# Patient Record
Sex: Female | Born: 1965 | Race: White | Hispanic: No | Marital: Married | Smoking: Current every day smoker
Health system: Southern US, Community
[De-identification: ages and names within clinical notes are randomized; demographics above are authoritative.]

## PROBLEM LIST (undated history)

## (undated) DIAGNOSIS — Z8489 Family history of other specified conditions: Secondary | ICD-10-CM

## (undated) DIAGNOSIS — F419 Anxiety disorder, unspecified: Secondary | ICD-10-CM

## (undated) DIAGNOSIS — K219 Gastro-esophageal reflux disease without esophagitis: Secondary | ICD-10-CM

## (undated) DIAGNOSIS — J189 Pneumonia, unspecified organism: Secondary | ICD-10-CM

## (undated) DIAGNOSIS — I499 Cardiac arrhythmia, unspecified: Secondary | ICD-10-CM

## (undated) DIAGNOSIS — R000 Tachycardia, unspecified: Secondary | ICD-10-CM

## (undated) DIAGNOSIS — IMO0001 Reserved for inherently not codable concepts without codable children: Secondary | ICD-10-CM

## (undated) HISTORY — PX: DILATION AND CURETTAGE OF UTERUS: SHX78

## (undated) HISTORY — PX: COLPOSCOPY: SHX161

---

## 2008-04-27 ENCOUNTER — Ambulatory Visit (HOSPITAL_COMMUNITY): Admission: RE | Admit: 2008-04-27 | Discharge: 2008-04-27 | Payer: Self-pay | Admitting: Unknown Physician Specialty

## 2010-11-19 ENCOUNTER — Encounter: Payer: Self-pay | Admitting: Unknown Physician Specialty

## 2010-11-20 ENCOUNTER — Encounter: Payer: Self-pay | Admitting: Unknown Physician Specialty

## 2015-03-29 ENCOUNTER — Other Ambulatory Visit: Payer: Self-pay | Admitting: Neurosurgery

## 2015-03-29 DIAGNOSIS — M5126 Other intervertebral disc displacement, lumbar region: Secondary | ICD-10-CM

## 2015-04-04 ENCOUNTER — Ambulatory Visit
Admission: RE | Admit: 2015-04-04 | Discharge: 2015-04-04 | Disposition: A | Discharge status: Home / Self Care | Payer: Federal, State, Local not specified - PPO | Source: Ambulatory Visit | Attending: Neurosurgery | Admitting: Neurosurgery

## 2015-04-04 DIAGNOSIS — M5126 Other intervertebral disc displacement, lumbar region: Secondary | ICD-10-CM

## 2015-04-04 MED ORDER — ONDANSETRON HCL 4 MG/2ML IJ SOLN: 4.0000 mg | Freq: Four times a day (QID) | INTRAMUSCULAR | Status: DC | PRN

## 2015-04-04 MED ORDER — DIAZEPAM 5 MG PO TABS: 10.0000 mg | ORAL_TABLET | Freq: Once | ORAL | Status: DC

## 2015-04-04 MED ORDER — DIAZEPAM 5 MG PO TABS
10.0000 mg | ORAL_TABLET | Freq: Once | ORAL | Status: AC
  Administered 2015-04-04: 10 mg via ORAL

## 2015-04-04 MED ORDER — IOHEXOL 180 MG/ML  SOLN
15.0000 mL | Freq: Once | INTRAMUSCULAR | Status: AC | PRN
  Administered 2015-04-04: 15 mL via INTRATHECAL

## 2015-04-04 NOTE — Discharge Instructions (Signed)
Myelogram Discharge Instructions  1. Go home and rest quietly for the next 24 hours.  It is important to lie flat for the next 24 hours.  Get up only to go to the restroom.  You may lie in the bed or on a couch on your back, your stomach, your left side or your right side.  You may have one pillow under your head.  You may have pillows between your knees while you are on your side or under your knees while you are on your back.  2. DO NOT drive today.  Recline the seat as far back as it will go, while still wearing your seat belt, on the way home.  3. You may get up to go to the bathroom as needed.  You may sit up for 10 minutes to eat.  You may resume your normal diet and medications unless otherwise indicated.  Drink lots of extra fluids today and tomorrow.  4. The incidence of headache, nausea, or vomiting is about 5% (one in 20 patients).  If you develop a headache, lie flat and drink plenty of fluids until the headache goes away.  Caffeinated beverages may be helpful.  If you develop severe nausea and vomiting or a headache that does not go away with flat bed rest, call [phone].  5. You may resume normal activities after your 24 hours of bed rest is over; however, do not exert yourself strongly or do any heavy lifting tomorrow. If when you get up you have a headache when standing, go back to bed and force fluids for another 24 hours.  6. Call your physician for a follow-up appointment.  The results of your myelogram will be sent directly to your physician by the following day.  7. If you have any questions or if complications develop after you arrive home, please call [phone].  Discharge instructions have been explained to the patient.  The patient, or the person responsible for the patient, fully understands these instructions.       May resume Fetzima on April 05, 2015, after 8:30 am.

## 2015-04-04 NOTE — Progress Notes (Signed)
Patient states she has been off Fetzima for the past two days.

## 2015-04-05 ENCOUNTER — Other Ambulatory Visit: Payer: Self-pay | Admitting: Neurosurgery

## 2015-04-07 ENCOUNTER — Encounter (HOSPITAL_COMMUNITY): Payer: Self-pay | Admitting: *Deleted

## 2015-04-07 MED ORDER — CEFAZOLIN SODIUM-DEXTROSE 2-3 GM-% IV SOLR
2.0000 g | INTRAVENOUS | Status: AC
  Administered 2015-04-08: 2 g via INTRAVENOUS
  Filled 2015-04-07: qty 50

## 2015-04-07 NOTE — Progress Notes (Addendum)
Mrs Makaleigh reports having chest pain-last time was 2 months ago. "Sharp pain middle of chest, 9 out of 10, couldn't catch breath and I would get nauseous.""  Patient also reports that it has happened a time or two in past year, "i just try to calm myself down and take deep breathes.  "One time it lasted a while and I had to take 2 Ativans."  Patient  York Spaniel that she informed her PCP, and he changed her anti depressant.  Patient also reported having a "fast heart rate, 100 is probably the lowest, denies any shortness of breath or lightheadedness with this. not get it on an EKG."   Patient had a stress at [facility] a year or so ago, but did not hear the results.   I called the OR to speak to the anesthesiologist regarding Mrs Morua reported chest pain, I spoke with CC, RN, charge nurse, who took the information for Dr Okey Dupre who was in an emergency surgery.

## 2015-04-07 NOTE — H&P (Signed)
Natalie Hines is an 49 y.o. female.   Chief Complaint: lumbar pain HPI: patient complaining of lumbar pain with radiation to the right led but now is going to both legs, no better with conservative treatment. Myelo was positive for stenosis  Past Medical History  Diagnosis Date  . Tachycardia   . Anxiety   . Dysrhythmia   . Irregular heart beat     at times  . Shortness of breath dyspnea     At times  . Pneumonia     as a teenager 3  times  . GERD (gastroesophageal reflux disease)     occasional- takes Zantac OTC  . Family history of adverse reaction to anesthesia     Mother and Sister- Nausea    Past Surgical History  Procedure Laterality Date  . Dilation and curettage of uterus    . Colposcopy      History reviewed. No pertinent family history. Social History:  reports that she has been smoking.  She does not have any smokeless tobacco history on file. She reports that she drinks alcohol. She reports that she does not use illicit drugs.  Allergies:  Allergies  Allergen Reactions  . Compazine [Prochlorperazine Edisylate] Other (See Comments)    Draws mouth and neck muscles open (tardive dyskinesia)    No prescriptions prior to admission    No results found for this or any previous visit (from the past 48 hour(s)). No results found.  Review of Systems  Constitutional: Negative.   HENT: Negative.   Eyes: Negative.   Respiratory: Negative.   Cardiovascular: Negative.   Gastrointestinal: Negative.   Genitourinary: Negative.   Musculoskeletal: Positive for back pain.  Skin: Negative.   Neurological: Positive for focal weakness.  Endo/Heme/Allergies: Negative.   Psychiatric/Behavioral: Negative.     Height 5\' 7"  (1.702 m), weight 79.379 kg (175 lb), last menstrual period 03/28/2015. Physical Exam hent, nl. Neck, nl. Cv, nl. Lungs, clear. Abdomen, soft. Extremities, nl. Neuro weakness of df right foot. Sensory nl. SLR POSITIVE AT 70 DEGREES. MYELO SHOS STENOSIS AT  L34, 45 right worse than left  Assessment/Plan Patient to go ahead with decompression at l34,45 . She and her husband are aware of risks and benefits  Miliana Gangwer M 04/07/2015, 6:40 PM

## 2015-04-08 ENCOUNTER — Inpatient Hospital Stay (HOSPITAL_COMMUNITY)
Admission: RE | Admit: 2015-04-08 | Discharge: 2015-04-09 | DRG: 517 | Disposition: A | Discharge status: Home / Self Care | Payer: Federal, State, Local not specified - PPO | Source: Ambulatory Visit | Attending: Neurosurgery | Admitting: Neurosurgery

## 2015-04-08 ENCOUNTER — Ambulatory Visit (HOSPITAL_COMMUNITY): Payer: Federal, State, Local not specified - PPO | Admitting: Certified Registered Nurse Anesthetist

## 2015-04-08 ENCOUNTER — Encounter (HOSPITAL_COMMUNITY): Payer: Self-pay

## 2015-04-08 ENCOUNTER — Ambulatory Visit (HOSPITAL_COMMUNITY): Payer: Federal, State, Local not specified - PPO

## 2015-04-08 ENCOUNTER — Encounter (HOSPITAL_COMMUNITY)
Admission: RE | Disposition: A | Discharge status: Home / Self Care | Payer: Self-pay | Source: Ambulatory Visit | Attending: Neurosurgery

## 2015-04-08 DIAGNOSIS — M4806 Spinal stenosis, lumbar region: Principal | ICD-10-CM | POA: Diagnosis present

## 2015-04-08 DIAGNOSIS — Z888 Allergy status to other drugs, medicaments and biological substances status: Secondary | ICD-10-CM | POA: Diagnosis not present

## 2015-04-08 DIAGNOSIS — M5416 Radiculopathy, lumbar region: Secondary | ICD-10-CM | POA: Diagnosis present

## 2015-04-08 DIAGNOSIS — K219 Gastro-esophageal reflux disease without esophagitis: Secondary | ICD-10-CM | POA: Diagnosis present

## 2015-04-08 DIAGNOSIS — M48061 Spinal stenosis, lumbar region without neurogenic claudication: Secondary | ICD-10-CM | POA: Diagnosis present

## 2015-04-08 DIAGNOSIS — F17210 Nicotine dependence, cigarettes, uncomplicated: Secondary | ICD-10-CM | POA: Diagnosis present

## 2015-04-08 DIAGNOSIS — F419 Anxiety disorder, unspecified: Secondary | ICD-10-CM | POA: Diagnosis present

## 2015-04-08 DIAGNOSIS — M545 Low back pain: Secondary | ICD-10-CM | POA: Diagnosis present

## 2015-04-08 DIAGNOSIS — Z419 Encounter for procedure for purposes other than remedying health state, unspecified: Secondary | ICD-10-CM

## 2015-04-08 HISTORY — DX: Cardiac arrhythmia, unspecified: I49.9

## 2015-04-08 HISTORY — PX: LUMBAR LAMINECTOMY/DECOMPRESSION MICRODISCECTOMY: SHX5026

## 2015-04-08 HISTORY — DX: Gastro-esophageal reflux disease without esophagitis: K21.9

## 2015-04-08 HISTORY — DX: Reserved for inherently not codable concepts without codable children: IMO0001

## 2015-04-08 HISTORY — DX: Anxiety disorder, unspecified: F41.9

## 2015-04-08 HISTORY — DX: Tachycardia, unspecified: R00.0

## 2015-04-08 HISTORY — DX: Family history of other specified conditions: Z84.89

## 2015-04-08 HISTORY — DX: Pneumonia, unspecified organism: J18.9

## 2015-04-08 LAB — CBC
HEMATOCRIT: 39.7 % (ref 36.0–46.0)
Hemoglobin: 13.5 g/dL (ref 12.0–15.0)
MCH: 29.9 pg (ref 26.0–34.0)
MCHC: 34 g/dL (ref 30.0–36.0)
MCV: 87.8 fL (ref 78.0–100.0)
Platelets: 348 10*3/uL (ref 150–400)
RBC: 4.52 MIL/uL (ref 3.87–5.11)
RDW: 13.5 % (ref 11.5–15.5)
WBC: 6.4 10*3/uL (ref 4.0–10.5)

## 2015-04-08 LAB — BASIC METABOLIC PANEL
Anion gap: 8 (ref 5–15)
BUN: 13 mg/dL (ref 6–20)
CHLORIDE: 108 mmol/L (ref 101–111)
CO2: 22 mmol/L (ref 22–32)
CREATININE: 0.71 mg/dL (ref 0.44–1.00)
Calcium: 8.7 mg/dL — ABNORMAL LOW (ref 8.9–10.3)
GFR calc Af Amer: >60 mL/min (ref >60)
GLUCOSE: 95 mg/dL (ref 65–99)
Potassium: 4.1 mmol/L (ref 3.5–5.1)
SODIUM: 138 mmol/L (ref 135–145)

## 2015-04-08 LAB — SURGICAL PCR SCREEN
MRSA, PCR: NEGATIVE
Staphylococcus aureus: POSITIVE — AB

## 2015-04-08 LAB — HCG, SERUM, QUALITATIVE: PREG SERUM: NEGATIVE

## 2015-04-08 SURGERY — LUMBAR LAMINECTOMY/DECOMPRESSION MICRODISCECTOMY 2 LEVELS
Anesthesia: General | Site: Spine Lumbar | Laterality: Bilateral

## 2015-04-08 MED ORDER — VANCOMYCIN HCL 1000 MG IV SOLR
INTRAVENOUS | Status: AC
  Filled 2015-04-08: qty 1000

## 2015-04-08 MED ORDER — ACETAMINOPHEN 325 MG PO TABS
650.0000 mg | ORAL_TABLET | ORAL | Status: DC | PRN
  Filled 2015-04-08: qty 2

## 2015-04-08 MED ORDER — MENTHOL 3 MG MT LOZG: 1.0000 | LOZENGE | OROMUCOSAL | Status: DC | PRN

## 2015-04-08 MED ORDER — DEXAMETHASONE SODIUM PHOSPHATE 4 MG/ML IJ SOLN
INTRAMUSCULAR | Status: DC | PRN
  Administered 2015-04-08: 4 mg via INTRAVENOUS

## 2015-04-08 MED ORDER — MIDAZOLAM HCL 2 MG/2ML IJ SOLN
INTRAMUSCULAR | Status: AC
  Filled 2015-04-08: qty 2

## 2015-04-08 MED ORDER — ONDANSETRON HCL 4 MG/2ML IJ SOLN
INTRAMUSCULAR | Status: AC
  Filled 2015-04-08: qty 2

## 2015-04-08 MED ORDER — CEFAZOLIN SODIUM 1-5 GM-% IV SOLN
1.0000 g | Freq: Three times a day (TID) | INTRAVENOUS | Status: AC
  Administered 2015-04-09 (×2): 1 g via INTRAVENOUS
  Filled 2015-04-08 (×2): qty 50

## 2015-04-08 MED ORDER — ACETAMINOPHEN 650 MG RE SUPP
650.0000 mg | RECTAL | Status: DC | PRN
  Filled 2015-04-08: qty 1

## 2015-04-08 MED ORDER — DEXAMETHASONE SODIUM PHOSPHATE 4 MG/ML IJ SOLN
INTRAMUSCULAR | Status: AC
  Filled 2015-04-08: qty 1

## 2015-04-08 MED ORDER — CEFAZOLIN SODIUM 1-5 GM-% IV SOLN
1.0000 g | Freq: Three times a day (TID) | INTRAVENOUS | Status: DC
  Filled 2015-04-08 (×2): qty 50

## 2015-04-08 MED ORDER — SODIUM CHLORIDE 0.9 % IJ SOLN: 3.0000 mL | INTRAMUSCULAR | Status: DC | PRN

## 2015-04-08 MED ORDER — PHENOL 1.4 % MT LIQD: 1.0000 | OROMUCOSAL | Status: DC | PRN

## 2015-04-08 MED ORDER — FENTANYL CITRATE (PF) 250 MCG/5ML IJ SOLN
INTRAMUSCULAR | Status: AC
  Filled 2015-04-08: qty 5

## 2015-04-08 MED ORDER — ARTIFICIAL TEARS OP OINT
TOPICAL_OINTMENT | OPHTHALMIC | Status: AC
  Filled 2015-04-08: qty 3.5

## 2015-04-08 MED ORDER — OXYCODONE HCL 5 MG PO TABS
ORAL_TABLET | ORAL | Status: AC
  Filled 2015-04-08: qty 1

## 2015-04-08 MED ORDER — CYCLOBENZAPRINE HCL 10 MG PO TABS: 10.0000 mg | ORAL_TABLET | Freq: Three times a day (TID) | ORAL | Status: DC | PRN

## 2015-04-08 MED ORDER — ONDANSETRON HCL 4 MG/2ML IJ SOLN
4.0000 mg | INTRAMUSCULAR | Status: DC | PRN
  Filled 2015-04-08: qty 2

## 2015-04-08 MED ORDER — SENNOSIDES-DOCUSATE SODIUM 8.6-50 MG PO TABS: 1.0000 | ORAL_TABLET | Freq: Every evening | ORAL | Status: DC | PRN

## 2015-04-08 MED ORDER — MORPHINE SULFATE 2 MG/ML IJ SOLN: 1.0000 mg | INTRAMUSCULAR | Status: DC | PRN

## 2015-04-08 MED ORDER — MUPIROCIN 2 % EX OINT
1.0000 "application " | TOPICAL_OINTMENT | Freq: Once | CUTANEOUS | Status: AC
  Administered 2015-04-08: 1 via TOPICAL
  Filled 2015-04-08: qty 22

## 2015-04-08 MED ORDER — SODIUM CHLORIDE 0.9 % IV SOLN: 250.0000 mL | INTRAVENOUS | Status: DC

## 2015-04-08 MED ORDER — GLYCOPYRROLATE 0.2 MG/ML IJ SOLN
INTRAMUSCULAR | Status: AC
  Filled 2015-04-08: qty 2

## 2015-04-08 MED ORDER — VANCOMYCIN HCL 1000 MG IV SOLR
INTRAVENOUS | Status: DC | PRN
  Administered 2015-04-08: 1000 mg via TOPICAL

## 2015-04-08 MED ORDER — OXYCODONE HCL 5 MG/5ML PO SOLN: 5.0000 mg | Freq: Once | ORAL | Status: AC | PRN

## 2015-04-08 MED ORDER — ROCURONIUM BROMIDE 50 MG/5ML IV SOLN
INTRAVENOUS | Status: AC
  Filled 2015-04-08: qty 1

## 2015-04-08 MED ORDER — LIDOCAINE HCL (CARDIAC) 20 MG/ML IV SOLN
INTRAVENOUS | Status: DC | PRN
  Administered 2015-04-08: 60 mg via INTRAVENOUS

## 2015-04-08 MED ORDER — LEVOMILNACIPRAN HCL ER 40 MG PO CP24: 40.0000 mg | ORAL_CAPSULE | Freq: Every day | ORAL | Status: DC

## 2015-04-08 MED ORDER — OXYCODONE HCL 5 MG PO TABS
5.0000 mg | ORAL_TABLET | Freq: Once | ORAL | Status: AC | PRN
  Administered 2015-04-08: 5 mg via ORAL

## 2015-04-08 MED ORDER — SODIUM CHLORIDE 0.9 % IV SOLN: INTRAVENOUS | Status: DC

## 2015-04-08 MED ORDER — HYDROMORPHONE HCL 1 MG/ML IJ SOLN
INTRAMUSCULAR | Status: AC
  Administered 2015-04-08: 0.5 mg via INTRAVENOUS
  Filled 2015-04-08: qty 1

## 2015-04-08 MED ORDER — DOXYCYCLINE MONOHYDRATE 50 MG PO CAPS
50.0000 mg | ORAL_CAPSULE | Freq: Two times a day (BID) | ORAL | Status: DC | PRN
  Filled 2015-04-08: qty 1

## 2015-04-08 MED ORDER — NEOSTIGMINE METHYLSULFATE 10 MG/10ML IV SOLN
INTRAVENOUS | Status: AC
  Filled 2015-04-08: qty 1

## 2015-04-08 MED ORDER — LIDOCAINE HCL (CARDIAC) 20 MG/ML IV SOLN
INTRAVENOUS | Status: AC
  Filled 2015-04-08: qty 10

## 2015-04-08 MED ORDER — OXYCODONE-ACETAMINOPHEN 5-325 MG PO TABS
1.0000 | ORAL_TABLET | ORAL | Status: DC | PRN
  Administered 2015-04-08 – 2015-04-09 (×2): 1 via ORAL
  Administered 2015-04-09: 2 via ORAL
  Administered 2015-04-09: 1 via ORAL
  Filled 2015-04-08: qty 2
  Filled 2015-04-08 (×2): qty 1
  Filled 2015-04-08: qty 2

## 2015-04-08 MED ORDER — GABAPENTIN 300 MG PO CAPS
300.0000 mg | ORAL_CAPSULE | Freq: Every evening | ORAL | Status: DC | PRN
  Filled 2015-04-08: qty 1

## 2015-04-08 MED ORDER — DIAZEPAM 5 MG PO TABS
ORAL_TABLET | ORAL | Status: AC
  Filled 2015-04-08: qty 1

## 2015-04-08 MED ORDER — PROPOFOL 10 MG/ML IV BOLUS
INTRAVENOUS | Status: DC | PRN
  Administered 2015-04-08: 170 mg via INTRAVENOUS

## 2015-04-08 MED ORDER — BUPIVACAINE LIPOSOME 1.3 % IJ SUSP
20.0000 mL | INTRAMUSCULAR | Status: AC
  Administered 2015-04-08: 20 mL
  Filled 2015-04-08: qty 20

## 2015-04-08 MED ORDER — ZOLPIDEM TARTRATE 5 MG PO TABS
5.0000 mg | ORAL_TABLET | Freq: Every evening | ORAL | Status: DC | PRN
Start: 2015-04-08 — End: 2015-04-09

## 2015-04-08 MED ORDER — GLYCOPYRROLATE 0.2 MG/ML IJ SOLN
INTRAMUSCULAR | Status: DC | PRN
  Administered 2015-04-08: .6 mg via INTRAVENOUS

## 2015-04-08 MED ORDER — DIPHENHYDRAMINE HCL 50 MG/ML IJ SOLN
INTRAMUSCULAR | Status: DC | PRN
  Administered 2015-04-08: 25 mg via INTRAVENOUS

## 2015-04-08 MED ORDER — ACETAMINOPHEN 325 MG PO TABS: 650.0000 mg | ORAL_TABLET | ORAL | Status: DC | PRN

## 2015-04-08 MED ORDER — SODIUM CHLORIDE 0.9 % IJ SOLN
3.0000 mL | Freq: Two times a day (BID) | INTRAMUSCULAR | Status: DC
  Administered 2015-04-08 – 2015-04-09 (×2): 3 mL via INTRAVENOUS

## 2015-04-08 MED ORDER — ACETAMINOPHEN 160 MG/5ML PO SOLN
325.0000 mg | ORAL | Status: DC | PRN
  Filled 2015-04-08: qty 20.3

## 2015-04-08 MED ORDER — DIAZEPAM 5 MG PO TABS
5.0000 mg | ORAL_TABLET | Freq: Four times a day (QID) | ORAL | Status: DC | PRN
  Administered 2015-04-08 – 2015-04-09 (×3): 5 mg via ORAL
  Filled 2015-04-08 (×3): qty 1

## 2015-04-08 MED ORDER — ONDANSETRON HCL 4 MG/2ML IJ SOLN
INTRAMUSCULAR | Status: DC | PRN
  Administered 2015-04-08: 4 mg via INTRAVENOUS

## 2015-04-08 MED ORDER — HEMOSTATIC AGENTS (NO CHARGE) OPTIME
TOPICAL | Status: DC | PRN
  Administered 2015-04-08: 1 via TOPICAL

## 2015-04-08 MED ORDER — LACTATED RINGERS IV SOLN
INTRAVENOUS | Status: DC
  Administered 2015-04-08 (×2): via INTRAVENOUS

## 2015-04-08 MED ORDER — ACETAMINOPHEN 650 MG RE SUPP: 650.0000 mg | RECTAL | Status: DC | PRN

## 2015-04-08 MED ORDER — NEOSTIGMINE METHYLSULFATE 10 MG/10ML IV SOLN
INTRAVENOUS | Status: DC | PRN
  Administered 2015-04-08: 4 mg via INTRAVENOUS

## 2015-04-08 MED ORDER — ONDANSETRON HCL 4 MG/2ML IJ SOLN: 4.0000 mg | INTRAMUSCULAR | Status: DC | PRN

## 2015-04-08 MED ORDER — FENTANYL CITRATE (PF) 100 MCG/2ML IJ SOLN
INTRAMUSCULAR | Status: DC | PRN
  Administered 2015-04-08: 150 ug via INTRAVENOUS
  Administered 2015-04-08: 100 ug via INTRAVENOUS

## 2015-04-08 MED ORDER — THROMBIN 5000 UNITS EX SOLR
CUTANEOUS | Status: DC | PRN
  Administered 2015-04-08 (×2): 5000 [IU] via TOPICAL

## 2015-04-08 MED ORDER — MENTHOL 3 MG MT LOZG
1.0000 | LOZENGE | OROMUCOSAL | Status: DC | PRN
  Filled 2015-04-08: qty 9

## 2015-04-08 MED ORDER — ZOLPIDEM TARTRATE 5 MG PO TABS: 5.0000 mg | ORAL_TABLET | Freq: Every evening | ORAL | Status: DC | PRN

## 2015-04-08 MED ORDER — SODIUM CHLORIDE 0.9 % IV SOLN
INTRAVENOUS | Status: DC
  Administered 2015-04-08: 75 mL/h via INTRAVENOUS
  Administered 2015-04-09: 06:00:00 via INTRAVENOUS

## 2015-04-08 MED ORDER — PHENOL 1.4 % MT LIQD
1.0000 | OROMUCOSAL | Status: DC | PRN
  Filled 2015-04-08: qty 177

## 2015-04-08 MED ORDER — 0.9 % SODIUM CHLORIDE (POUR BTL) OPTIME
TOPICAL | Status: DC | PRN
  Administered 2015-04-08: 1000 mL

## 2015-04-08 MED ORDER — ROCURONIUM BROMIDE 100 MG/10ML IV SOLN
INTRAVENOUS | Status: DC | PRN
  Administered 2015-04-08: 50 mg via INTRAVENOUS

## 2015-04-08 MED ORDER — ATORVASTATIN CALCIUM 10 MG PO TABS
20.0000 mg | ORAL_TABLET | Freq: Every day | ORAL | Status: DC
  Administered 2015-04-08 – 2015-04-09 (×2): 20 mg via ORAL
  Filled 2015-04-08: qty 1
  Filled 2015-04-08 (×2): qty 2

## 2015-04-08 MED ORDER — OXYCODONE-ACETAMINOPHEN 5-325 MG PO TABS: 1.0000 | ORAL_TABLET | ORAL | Status: DC | PRN

## 2015-04-08 MED ORDER — HYDROMORPHONE HCL 1 MG/ML IJ SOLN
0.2500 mg | INTRAMUSCULAR | Status: DC | PRN
  Administered 2015-04-08 (×4): 0.5 mg via INTRAVENOUS

## 2015-04-08 MED ORDER — ACETAMINOPHEN 325 MG PO TABS: 325.0000 mg | ORAL_TABLET | ORAL | Status: DC | PRN

## 2015-04-08 MED ORDER — DILTIAZEM HCL ER COATED BEADS 240 MG PO CP24
240.0000 mg | ORAL_CAPSULE | Freq: Every day | ORAL | Status: DC
  Administered 2015-04-09: 240 mg via ORAL
  Filled 2015-04-08 (×2): qty 1

## 2015-04-08 SURGICAL SUPPLY — 50 items
BENZOIN TINCTURE PRP APPL 2/3 (GAUZE/BANDAGES/DRESSINGS) ×2 IMPLANT
BLADE CLIPPER SURG (BLADE) IMPLANT
BUR ACORN 6.0 (BURR) ×2 IMPLANT
BUR MATCHSTICK NEURO 3.0 LAGG (BURR) ×2 IMPLANT
CANISTER SUCT 3000ML PPV (MISCELLANEOUS) ×2 IMPLANT
CONT SPEC 4OZ CLIKSEAL STRL BL (MISCELLANEOUS) ×2 IMPLANT
DRAPE LAPAROTOMY 100X72X124 (DRAPES) ×2 IMPLANT
DRAPE MICROSCOPE LEICA (MISCELLANEOUS) ×2 IMPLANT
DRAPE POUCH INSTRU U-SHP 10X18 (DRAPES) ×2 IMPLANT
DRSG PAD ABDOMINAL 8X10 ST (GAUZE/BANDAGES/DRESSINGS) IMPLANT
DURAPREP 26ML APPLICATOR (WOUND CARE) ×2 IMPLANT
ELECT REM PT RETURN 9FT ADLT (ELECTROSURGICAL) ×2
ELECTRODE REM PT RTRN 9FT ADLT (ELECTROSURGICAL) ×1 IMPLANT
GAUZE SPONGE 4X4 12PLY STRL (GAUZE/BANDAGES/DRESSINGS) IMPLANT
GAUZE SPONGE 4X4 16PLY XRAY LF (GAUZE/BANDAGES/DRESSINGS) IMPLANT
GLOVE BIOGEL M 8.0 STRL (GLOVE) ×2 IMPLANT
GLOVE EXAM NITRILE LRG STRL (GLOVE) IMPLANT
GLOVE EXAM NITRILE MD LF STRL (GLOVE) IMPLANT
GLOVE EXAM NITRILE XL STR (GLOVE) IMPLANT
GLOVE EXAM NITRILE XS STR PU (GLOVE) IMPLANT
GOWN STRL REUS W/ TWL LRG LVL3 (GOWN DISPOSABLE) ×2 IMPLANT
GOWN STRL REUS W/ TWL XL LVL3 (GOWN DISPOSABLE) ×1 IMPLANT
GOWN STRL REUS W/TWL 2XL LVL3 (GOWN DISPOSABLE) IMPLANT
GOWN STRL REUS W/TWL LRG LVL3 (GOWN DISPOSABLE) ×2
GOWN STRL REUS W/TWL XL LVL3 (GOWN DISPOSABLE) ×1
KIT BASIN OR (CUSTOM PROCEDURE TRAY) ×2 IMPLANT
KIT ROOM TURNOVER OR (KITS) ×2 IMPLANT
NEEDLE HYPO 18GX1.5 BLUNT FILL (NEEDLE) IMPLANT
NEEDLE HYPO 21X1.5 SAFETY (NEEDLE) ×2 IMPLANT
NEEDLE HYPO 25X1 1.5 SAFETY (NEEDLE) IMPLANT
NEEDLE SPNL 20GX3.5 QUINCKE YW (NEEDLE) IMPLANT
NS IRRIG 1000ML POUR BTL (IV SOLUTION) ×2 IMPLANT
PACK LAMINECTOMY NEURO (CUSTOM PROCEDURE TRAY) ×2 IMPLANT
PAD ABD 8X10 STRL (GAUZE/BANDAGES/DRESSINGS) ×2 IMPLANT
PAD ARMBOARD 7.5X6 YLW CONV (MISCELLANEOUS) ×6 IMPLANT
PATTIES SURGICAL .5 X1 (DISPOSABLE) ×2 IMPLANT
RUBBERBAND STERILE (MISCELLANEOUS) ×4 IMPLANT
SPONGE LAP 4X18 X RAY DECT (DISPOSABLE) IMPLANT
SPONGE SURGIFOAM ABS GEL SZ50 (HEMOSTASIS) ×2 IMPLANT
STRIP CLOSURE SKIN 1/2X4 (GAUZE/BANDAGES/DRESSINGS) ×2 IMPLANT
SUT VIC AB 0 CT1 18XCR BRD8 (SUTURE) ×1 IMPLANT
SUT VIC AB 0 CT1 8-18 (SUTURE) ×1
SUT VIC AB 2-0 CP2 18 (SUTURE) ×2 IMPLANT
SUT VIC AB 3-0 SH 8-18 (SUTURE) ×2 IMPLANT
SYR 20CC LL (SYRINGE) ×2 IMPLANT
SYR 20ML ECCENTRIC (SYRINGE) ×2 IMPLANT
SYR 5ML LL (SYRINGE) IMPLANT
TOWEL OR 17X24 6PK STRL BLUE (TOWEL DISPOSABLE) ×2 IMPLANT
TOWEL OR 17X26 10 PK STRL BLUE (TOWEL DISPOSABLE) ×2 IMPLANT
WATER STERILE IRR 1000ML POUR (IV SOLUTION) ×2 IMPLANT

## 2015-04-08 NOTE — Progress Notes (Signed)
Patient admitted through PACU. Patient alert and oriented x 4. Patient oriented to room and made comfortable.  

## 2015-04-08 NOTE — Transfer of Care (Signed)
Immediate Anesthesia Transfer of Care Note  Patient: Natalie Hines  Procedure(s) Performed: Procedure(s) with comments: Bilateral L3-4 L4-5 Laminectomy (Bilateral) - Bilateral L3-4 L4-5 Laminectomy  Patient Location: PACU  Anesthesia Type:General  Level of Consciousness: awake, alert  and oriented  Airway & Oxygen Therapy: Patient Spontanous Breathing and Patient connected to nasal cannula oxygen  Post-op Assessment: Report given to RN and Post -op Vital signs reviewed and stable  Post vital signs: Reviewed and stable  Last Vitals:  Filed Vitals:   04/08/15 1654  BP:   Pulse:   Temp: 36.7 C  Resp:     Complications: No apparent anesthesia complications

## 2015-04-08 NOTE — Anesthesia Procedure Notes (Signed)
Procedure Name: Intubation Date/Time: 04/08/2015 3:30 PM Performed by: Margaree Mackintosh Pre-anesthesia Checklist: Patient identified, Emergency Drugs available, Suction available, Patient being monitored and Timeout performed Patient Re-evaluated:Patient Re-evaluated prior to inductionOxygen Delivery Method: Circle system utilized Preoxygenation: Pre-oxygenation with 100% oxygen Intubation Type: IV induction Ventilation: Mask ventilation without difficulty Laryngoscope Size: Glidescope and 3 Grade View: Grade II Tube type: Oral Tube size: 7.0 mm Number of attempts: 3 Airway Equipment and Method: Stylet,  Video-laryngoscopy and Bougie stylet Placement Confirmation: ETT inserted through vocal cords under direct vision,  positive ETCO2 and breath sounds checked- equal and bilateral Secured at: 20 cm Tube secured with: Tape Dental Injury: Teeth and Oropharynx as per pre-operative assessment  Difficulty Due To: Difficult Airway- due to anterior larynx Future Recommendations: Recommend- induction with short-acting agent, and alternative techniques readily available Comments: DL x 1 Basilio Cairo, CRNA Grade IV view, attempted bougie stylet rendered esophageal intubation. Mask ventilation resumed DLx 2 Corky Sox, MD Grade IV view, attempted bougie. Mask ventilation resumed. Glidescope utilized for  DL x 3 by CRNA. Grade II view ETT passed through glottic opening. VSS

## 2015-04-08 NOTE — Anesthesia Preprocedure Evaluation (Addendum)
Anesthesia Evaluation  Patient identified by MRN, date of birth, ID band Patient awake    Reviewed: Allergy & Precautions, NPO status , Patient's Chart, lab work & pertinent test results  History of Anesthesia Complications Negative for: history of anesthetic complications  Airway Mallampati: III  TM Distance: <3 FB Neck ROM: Full    Dental  (+) Teeth Intact,    Pulmonary neg shortness of breath, neg sleep apnea, neg COPDneg recent URI, Current Smoker,  breath sounds clear to auscultation        Cardiovascular - angina- Past MI and - CHF + dysrhythmias Supra Ventricular Tachycardia Rhythm:Regular     Neuro/Psych PSYCHIATRIC DISORDERS Anxiety negative neurological ROS     GI/Hepatic negative GI ROS, Neg liver ROS,   Endo/Other  negative endocrine ROS  Renal/GU negative Renal ROS     Musculoskeletal negative musculoskeletal ROS (+)   Abdominal   Peds  Hematology negative hematology ROS (+)   Anesthesia Other Findings   Reproductive/Obstetrics                            Anesthesia Physical Anesthesia Plan  ASA: II  Anesthesia Plan: General   Post-op Pain Management:    Induction: Intravenous  Airway Management Planned: Oral ETT  Additional Equipment: None  Intra-op Plan:   Post-operative Plan: Extubation in OR  Informed Consent: I have reviewed the patients History and Physical, chart, labs and discussed the procedure including the risks, benefits and alternatives for the proposed anesthesia with the patient or authorized representative who has indicated his/her understanding and acceptance.   Dental advisory given  Plan Discussed with: CRNA and Surgeon  Anesthesia Plan Comments:         Anesthesia Quick Evaluation

## 2015-04-08 NOTE — Anesthesia Postprocedure Evaluation (Signed)
  Anesthesia Post-op Note  Patient: Natalie Hines  Procedure(s) Performed: Procedure(s) with comments: Bilateral L3-4 L4-5 Laminectomy (Bilateral) - Bilateral L3-4 L4-5 Laminectomy  Patient Location: PACU  Anesthesia Type: General   Level of Consciousness: awake, alert  and oriented  Airway and Oxygen Therapy: Patient Spontanous Breathing  Post-op Pain: moderate  Post-op Assessment: Post-op Vital signs reviewed  Post-op Vital Signs: Reviewed  Last Vitals:  Filed Vitals:   04/08/15 1700  BP:   Pulse: 91  Temp:   Resp: 14    Complications: No apparent anesthesia complications

## 2015-04-09 NOTE — Evaluation (Signed)
Occupational Therapy Evaluation and Discharge Patient Details Name: Natalie Hines MRN: [id number] DOB: October 26, 1966 Today's Date: 04/09/2015    History of Present Illness 49 y.o. female s/p Bilateral L3 and L4 laminectomy, foraminotomy to decompress the L3, L4, and L5 nerve roots bilaterally   Clinical Impression   PTA pt lived at home and was independent with ADLs. Pt currently at Supervision level for functional mobility and ADLs and educated on incorporating back precautions into ADLS. No further acute OT needs at this time.     Follow Up Recommendations  No OT follow up    Equipment Recommendations  Tub/shower seat    Recommendations for Other Services       Precautions / Restrictions Precautions Precautions: Back Precaution Booklet Issued: Yes (comment) Precaution Comments: reviewed Restrictions Weight Bearing Restrictions: No      Mobility Bed Mobility               General bed mobility comments: sitting in chair  Transfers Overall transfer level: Needs assistance Equipment used: None Transfers: Sit to/from Stand Sit to Stand: Supervision         General transfer comment: Supervision for safety.     Balance Overall balance assessment: Needs assistance Sitting-balance support: No upper extremity supported;Feet supported Sitting balance-Leahy Scale: Good     Standing balance support: No upper extremity supported Standing balance-Leahy Scale: Fair                              ADL Overall ADL's : Needs assistance/impaired                                       General ADL Comments: Pt at Supervision level for ADLs. Educated pt on fall prevention and incorporating back precautions into ADLs including use of shower seat and reacher. Also discussed toilet aid (tongs) for toilet hygiene.      Vision Additional Comments: No change from baseline   Perception     Praxis      Pertinent Vitals/Pain Pain Assessment:  0-10 Pain Score: 3  Pain Location: back Pain Descriptors / Indicators: Aching Pain Intervention(s): Monitored during session     Hand Dominance Left   Extremity/Trunk Assessment Upper Extremity Assessment Upper Extremity Assessment: Overall WFL for tasks assessed   Lower Extremity Assessment Lower Extremity Assessment: Defer to PT evaluation   Cervical / Trunk Assessment Cervical / Trunk Assessment: Normal   Communication Communication Communication: No difficulties   Cognition Arousal/Alertness: Awake/alert Behavior During Therapy: WFL for tasks assessed/performed Overall Cognitive Status: Within Functional Limits for tasks assessed                                Home Living Family/patient expects to be discharged to:: Private residence Living Arrangements: Spouse/significant other Available Help at Discharge: Family;Available 24 hours/day Type of Home: House Home Access: Stairs to enter [employer] of Steps: 1 Entrance Stairs-Rails: None Home Layout: One level     Bathroom Shower/Tub: Walk-in shower         Home Equipment: Gilmer Mor - single point;Walker - 2 wheels;Shower seat          Prior Functioning/Environment Level of Independence: Independent             OT Diagnosis: Generalized weakness;Acute pain    End of  Session  Activity Tolerance: Patient tolerated treatment well Patient left: in chair;with call bell/phone within reach   Time: 1047-1102 OT Time Calculation (min): 15 min Charges:  OT General Charges $OT Visit: 1 Procedure OT Evaluation $Initial OT Evaluation Tier I: 1 Procedure G-Codes:    Rae Lips 05-06-2015, 11:10 AM  Carney Living, OTR/L Occupational Therapist [phone] (pager)

## 2015-04-09 NOTE — Discharge Summary (Signed)
Physician Discharge Summary  Patient ID: Natalie Hines MRN: [id number] DOB/AGE: June 11, 1966 49 y.o.  Admit date: 04/08/2015 Discharge date: 04/09/2015  Admission Diagnoses:lumbar stenosis  Discharge Diagnoses:  Active Problems:   Lumbar stenosis   Discharged Condition: no pain or weakness  Hospital Course: surgery  Consults: none  Significant Diagnostic Studies: myelo  Treatments:decompressive lumbar laminectomies  Discharge Exam: Blood pressure 96/54, pulse 87, temperature 98.5 F (36.9 C), temperature source Oral, resp. rate 18, height 5\' 7"  (1.702 m), weight 79.379 kg (175 lb), last menstrual period 03/28/2015, SpO2 96 %. No weakness  Disposition: wants to go home     Medication List    ASK your doctor about these medications        atorvastatin 20 MG tablet  Commonly known as:  LIPITOR  Take 20 mg by mouth daily.     clobetasol cream 0.05 %  Commonly known as:  TEMOVATE  Apply 1 application topically 2 (two) times daily as needed (for psoriasis).     cyclobenzaprine 10 MG tablet  Commonly known as:  FLEXERIL  Take 10 mg by mouth 2 (two) times daily as needed for muscle spasms.     diltiazem 240 MG 24 hr capsule  Commonly known as:  CARDIZEM CD  Take 240 mg by mouth daily.     doxycycline 50 MG capsule  Commonly known as:  MONODOX  Take 50 mg by mouth 2 (two) times daily as needed (for outbreaks).     fluticasone 50 MCG/ACT nasal spray  Commonly known as:  FLONASE  Place 1 spray into both nostrils daily as needed for allergies or rhinitis.     gabapentin 300 MG capsule  Commonly known as:  NEURONTIN  Take 300 mg by mouth at bedtime as needed (for pain).     HYDROcodone-acetaminophen 7.5-325 MG per tablet  Commonly known as:  NORCO  Take 1 tablet by mouth every 8 (eight) hours as needed for moderate pain.     Levomilnacipran HCl ER 40 MG Cp24  Take 40 mg by mouth daily.     LORazepam 0.5 MG tablet  Commonly known as:  ATIVAN  Take 0.5 mg by  mouth at bedtime.     meloxicam 7.5 MG tablet  Commonly known as:  MOBIC  Take 7.5 mg by mouth daily.         Signed: Karn Cassis 04/09/2015, 8:26 AM

## 2015-04-09 NOTE — Op Note (Signed)
Natalie Hines, Natalie Hines               ACCOUNT NO.:  [id number]  MEDICAL RECORD NO.:  [id number]  LOCATION:  [id number]                        FACILITY:  [facility]  PHYSICIAN:  Hilda Lias, M.D.   DATE OF BIRTH:  1966/08/24  DATE OF PROCEDURE:  04/08/2015 DATE OF DISCHARGE:  04/09/2015                              OPERATIVE REPORT   PREOPERATIVE DIAGNOSIS:  Lumbar stenosis L3-4 and L4-5 with chronic radiculopathy.  POSTOPERATIVE DIAGNOSIS:  Lumbar stenosis L3-4 and L4-5 with chronic radiculopathy.  PROCEDURE:  Bilateral L3 and L4 laminectomy, foraminotomy to decompress the L3, L4, and L5 nerve roots bilaterally.  Microscope.  SURGEON:  Hilda Lias, M.D.  ASSISTANT:  Dr. Marikay Alar.  CLINICAL HISTORY:  [name] is a lady, who had been complaining of back pain worse to both legs for many years.  The patient has failed conservative treatment including epidural injection.  Myelogram showed stenosis at the levels L3-4 and 4-5.  Surgery was advised.  She knew the risk and benefit of the procedure.  PROCEDURE IN DETAIL:  The patient was taken to the OR, and after intubation, she was positioned in prone manner.  The back was cleaned with DuraPrep and drapes were applied.  A midline incision from L3 down to L4 and L5 was made and muscle was retracted laterally.  The x-rays were taken.  At the beginning, I tried to do laminotomy, but the ligament was quite thick and calcified and I proceeded with removal of the spinous processes of 3 and 4, and with a drill, we removed the lamina of 3, 4, and open part of L5.  The ligament was quite thick and calcified and decompression was done medially and laterally.  Then using 1, 2, and 3-mm Kerrison punch, we did foraminotomy to decompress the L3, L4, and L5 nerve roots bilaterally.  At the end, we had plenty of space for the thecal sac.  Valsalva maneuver up to 40 was negative.  The area was irrigated.  Vancomycin powder was left in the operative  site, and the wound was closed with Vicryl and Steri-Strips.          ______________________________ Hilda Lias, M.D.     EB/MEDQ  D:  04/08/2015  T:  04/09/2015  Job:  [id number]

## 2015-04-09 NOTE — Progress Notes (Signed)
Orthopedic Tech Progress Note Patient Details:  Natalie Hines 11-17-1965 [id number]  Patient ID: Natalie Hines, female   DOB: 1966-06-21, 49 y.o.   MRN: [id number] Tammy Sours stated that Dr. Jeral Fruit STATED THAT the brace order has been cancelled  [name] 04/09/2015, 11:18 AM

## 2015-04-09 NOTE — Progress Notes (Signed)
Orthopedic Tech Progress Note Patient Details:  Natalie Hines Jul 12, 1966 [id number]  Patient ID: Natalie Hines, female   DOB: 06-29-1966, 49 y.o.   MRN: [id number] Called in bio-tech brace order; spoke with Ethlyn Daniels, [place] Marchiano 04/09/2015, 10:05 AM

## 2015-04-09 NOTE — Progress Notes (Signed)
Patient is being d/c, d/c instruction given, patient verbalized understanding. Patient also educated on back precautions.

## 2015-04-09 NOTE — Evaluation (Signed)
Physical Therapy Evaluation and Discharge Patient Details Name: Natalie Hines MRN: [id number] DOB: 07/31/1966 Today's Date: 04/09/2015   History of Present Illness  49 y.o. female s/p Bilateral L3 and L4 laminectomy, foraminotomy to decompress the L3, L4, and L5 nerve roots bilaterally  Clinical Impression  Patient evaluated by Physical Therapy with no further acute PT needs identified. All education has been completed and the patient has no further questions. Ambulates generally well, somewhat guarded, and is agreeable to use of a cane temporarily. Safely completed stair training and reviewed back precautions with patient. Husband available 24/7. See below for any follow-up Physial Therapy or equipment needs. PT is signing off. Thank you for this referral.     Follow Up Recommendations No PT follow up;Supervision for mobility/OOB    Equipment Recommendations  None recommended by PT    Recommendations for Other Services       Precautions / Restrictions Precautions Precautions: Back Precaution Booklet Issued: Yes (comment) Precaution Comments: reviewed Restrictions Weight Bearing Restrictions: No      Mobility  Bed Mobility               General bed mobility comments: sitting in chair  Transfers Overall transfer level: Needs assistance Equipment used: None Transfers: Sit to/from Stand Sit to Stand: Supervision         General transfer comment: supervision for safety. mild sway noted upon standing however did not require physical assist to correct. VC for hand placement  Ambulation/Gait Ambulation/Gait assistance: Supervision Ambulation Distance (Feet): 525 Feet Assistive device: None Gait Pattern/deviations: Step-through pattern;Decreased stride length;Drifts right/left Gait velocity: decreased   General Gait Details: Reaches for rail at times however no overt balance loss. Seems moderately guarded. VC for awareness of stability, minimal drift to Lt and  Rt.  Stairs Stairs: Yes Stairs assistance: Supervision Stair Management: One rail Left;Step to pattern;Forwards Number of Stairs: 2 General stair comments: VC for sequencing. Simulated task to home environment where she typically reaches for door frame. no loss of balance  Wheelchair Mobility    Modified Rankin (Stroke Patients Only)       Balance Overall balance assessment: Needs assistance Sitting-balance support: No upper extremity supported;Feet supported Sitting balance-Leahy Scale: Good     Standing balance support: No upper extremity supported Standing balance-Leahy Scale: Fair                               Pertinent Vitals/Pain Pain Assessment: 0-10 Pain Score: 3  Pain Location: back Pain Descriptors / Indicators: Aching Pain Intervention(s): Monitored during session;Repositioned    Home Living Family/patient expects to be discharged to:: Private residence Living Arrangements: Spouse/significant other Available Help at Discharge: Family;Available 24 hours/day Type of Home: House Home Access: Stairs to enter Entrance Stairs-Rails: None Entrance Stairs-Number of Steps: 1 Home Layout: One level Home Equipment: Cane - single point;Walker - 2 wheels;Shower seat      Prior Function Level of Independence: Independent               Hand Dominance   Dominant Hand: Left    Extremity/Trunk Assessment   Upper Extremity Assessment: Defer to OT evaluation           Lower Extremity Assessment: Overall WFL for tasks assessed         Communication   Communication: No difficulties  Cognition Arousal/Alertness: Awake/alert Behavior During Therapy: WFL for tasks assessed/performed Overall Cognitive Status: Within Functional Limits for tasks assessed  General Comments General comments (skin integrity, edema, etc.): Reviewed back precautions, safety with mobility, and positioning. Husband present, very  supportive.    Exercises        Assessment/Plan    PT Assessment Patent does not need any further PT services  PT Diagnosis Abnormality of gait;Acute pain   PT Problem List    PT Treatment Interventions     PT Goals (Current goals can be found in the Care Plan section) Acute Rehab PT Goals Patient Stated Goal: return home PT Goal Formulation: All assessment and education complete, DC therapy    Frequency     Barriers to discharge        Co-evaluation               End of Session   Activity Tolerance: Patient tolerated treatment well Patient left: in chair;with call bell/phone within reach;with family/visitor present Nurse Communication: Mobility status         Time: 0913-0930 PT Time Calculation (min) (ACUTE ONLY): 17 min   Charges:   PT Evaluation $Initial PT Evaluation Tier I: 1 Procedure     PT G CodesBerton Mount 04/09/2015, 10:33 AM Charlsie Merles, PT [phone]

## 2015-04-11 ENCOUNTER — Encounter (HOSPITAL_COMMUNITY): Payer: Self-pay | Admitting: Neurosurgery

## 2015-06-16 ENCOUNTER — Telehealth (HOSPITAL_COMMUNITY): Payer: Self-pay | Admitting: *Deleted

## 2015-09-29 ENCOUNTER — Other Ambulatory Visit: Payer: Self-pay | Admitting: Neurosurgery

## 2015-09-29 DIAGNOSIS — M48061 Spinal stenosis, lumbar region without neurogenic claudication: Secondary | ICD-10-CM

## 2015-10-25 ENCOUNTER — Ambulatory Visit
Admission: RE | Admit: 2015-10-25 | Discharge: 2015-10-25 | Disposition: A | Discharge status: Home / Self Care | Payer: Federal, State, Local not specified - PPO | Source: Ambulatory Visit | Attending: Neurosurgery | Admitting: Neurosurgery

## 2015-10-25 VITALS — BP 96/60 | HR 78

## 2015-10-25 DIAGNOSIS — M48061 Spinal stenosis, lumbar region without neurogenic claudication: Secondary | ICD-10-CM

## 2015-10-25 MED ORDER — IOHEXOL 180 MG/ML  SOLN
15.0000 mL | Freq: Once | INTRAMUSCULAR | Status: AC | PRN
  Administered 2015-10-25: 15 mL via INTRATHECAL

## 2015-10-25 MED ORDER — ONDANSETRON HCL 4 MG/2ML IJ SOLN
4.0000 mg | Freq: Once | INTRAMUSCULAR | Status: AC
  Administered 2015-10-25: 4 mg via INTRAMUSCULAR

## 2015-10-25 MED ORDER — MEPERIDINE HCL 100 MG/ML IJ SOLN
75.0000 mg | Freq: Once | INTRAMUSCULAR | Status: AC
  Administered 2015-10-25: 75 mg via INTRAMUSCULAR

## 2015-10-25 MED ORDER — DIAZEPAM 5 MG PO TABS
5.0000 mg | ORAL_TABLET | Freq: Once | ORAL | Status: AC
  Administered 2015-10-25: 5 mg via ORAL

## 2015-10-25 NOTE — Progress Notes (Signed)
Pt states she has been off Cymbalta and Nortriptyline for the last 2 days. Discharge instructions explained to pt.

## 2015-10-25 NOTE — Discharge Instructions (Signed)
Myelogram Discharge Instructions  1. Go home and rest quietly for the next 24 hours.  It is important to lie flat for the next 24 hours.  Get up only to go to the restroom.  You may lie in the bed or on a couch on your back, your stomach, your left side or your right side.  You may have one pillow under your head.  You may have pillows between your knees while you are on your side or under your knees while you are on your back.  2. DO NOT drive today.  Recline the seat as far back as it will go, while still wearing your seat belt, on the way home.  3. You may get up to go to the bathroom as needed.  You may sit up for 10 minutes to eat.  You may resume your normal diet and medications unless otherwise indicated.  Drink lots of extra fluids today and tomorrow.  4. The incidence of headache, nausea, or vomiting is about 5% (one in 20 patients).  If you develop a headache, lie flat and drink plenty of fluids until the headache goes away.  Caffeinated beverages may be helpful.  If you develop severe nausea and vomiting or a headache that does not go away with flat bed rest, call [phone].  5. You may resume normal activities after your 24 hours of bed rest is over; however, do not exert yourself strongly or do any heavy lifting tomorrow. If when you get up you have a headache when standing, go back to bed and force fluids for another 24 hours.  6. Call your physician for a follow-up appointment.  The results of your myelogram will be sent directly to your physician by the following day.  7. If you have any questions or if complications develop after you arrive home, please call [phone].  Discharge instructions have been explained to the patient.  The patient, or the person responsible for the patient, fully understands these instructions.       May resume Cymbalta and Nortriptyline on Dec. 28, 2016, after 9:30 am.

## 2016-02-13 DIAGNOSIS — M5136 Other intervertebral disc degeneration, lumbar region: Secondary | ICD-10-CM | POA: Diagnosis not present

## 2016-02-13 DIAGNOSIS — M47817 Spondylosis without myelopathy or radiculopathy, lumbosacral region: Secondary | ICD-10-CM | POA: Diagnosis not present

## 2016-02-13 DIAGNOSIS — F411 Generalized anxiety disorder: Secondary | ICD-10-CM | POA: Diagnosis not present

## 2016-02-13 DIAGNOSIS — F331 Major depressive disorder, recurrent, moderate: Secondary | ICD-10-CM | POA: Diagnosis not present

## 2016-04-09 DIAGNOSIS — Z79899 Other long term (current) drug therapy: Secondary | ICD-10-CM | POA: Diagnosis not present

## 2016-04-09 DIAGNOSIS — F17210 Nicotine dependence, cigarettes, uncomplicated: Secondary | ICD-10-CM | POA: Diagnosis not present

## 2016-04-09 DIAGNOSIS — E782 Mixed hyperlipidemia: Secondary | ICD-10-CM | POA: Diagnosis not present

## 2016-04-09 DIAGNOSIS — M461 Sacroiliitis, not elsewhere classified: Secondary | ICD-10-CM | POA: Diagnosis not present

## 2016-04-09 DIAGNOSIS — F411 Generalized anxiety disorder: Secondary | ICD-10-CM | POA: Diagnosis not present

## 2016-04-09 DIAGNOSIS — F331 Major depressive disorder, recurrent, moderate: Secondary | ICD-10-CM | POA: Diagnosis not present

## 2016-04-09 DIAGNOSIS — M47817 Spondylosis without myelopathy or radiculopathy, lumbosacral region: Secondary | ICD-10-CM | POA: Diagnosis not present

## 2016-04-09 DIAGNOSIS — G8929 Other chronic pain: Secondary | ICD-10-CM | POA: Diagnosis not present

## 2016-04-23 DIAGNOSIS — M533 Sacrococcygeal disorders, not elsewhere classified: Secondary | ICD-10-CM | POA: Diagnosis not present

## 2016-04-23 DIAGNOSIS — M461 Sacroiliitis, not elsewhere classified: Secondary | ICD-10-CM | POA: Diagnosis not present

## 2016-05-14 DIAGNOSIS — M47817 Spondylosis without myelopathy or radiculopathy, lumbosacral region: Secondary | ICD-10-CM | POA: Diagnosis not present

## 2016-05-14 DIAGNOSIS — G8929 Other chronic pain: Secondary | ICD-10-CM | POA: Diagnosis not present

## 2016-05-14 DIAGNOSIS — F419 Anxiety disorder, unspecified: Secondary | ICD-10-CM | POA: Diagnosis not present

## 2016-05-14 DIAGNOSIS — Z79891 Long term (current) use of opiate analgesic: Secondary | ICD-10-CM | POA: Diagnosis not present

## 2016-05-14 DIAGNOSIS — M461 Sacroiliitis, not elsewhere classified: Secondary | ICD-10-CM | POA: Diagnosis not present

## 2016-06-04 DIAGNOSIS — M533 Sacrococcygeal disorders, not elsewhere classified: Secondary | ICD-10-CM | POA: Diagnosis not present

## 2016-06-11 DIAGNOSIS — F329 Major depressive disorder, single episode, unspecified: Secondary | ICD-10-CM | POA: Diagnosis not present

## 2016-06-11 DIAGNOSIS — M47817 Spondylosis without myelopathy or radiculopathy, lumbosacral region: Secondary | ICD-10-CM | POA: Diagnosis not present

## 2016-06-11 DIAGNOSIS — M961 Postlaminectomy syndrome, not elsewhere classified: Secondary | ICD-10-CM | POA: Diagnosis not present

## 2016-06-11 DIAGNOSIS — M461 Sacroiliitis, not elsewhere classified: Secondary | ICD-10-CM | POA: Diagnosis not present

## 2016-06-11 DIAGNOSIS — M5136 Other intervertebral disc degeneration, lumbar region: Secondary | ICD-10-CM | POA: Diagnosis not present

## 2016-07-09 DIAGNOSIS — Z79891 Long term (current) use of opiate analgesic: Secondary | ICD-10-CM | POA: Diagnosis not present

## 2016-07-09 DIAGNOSIS — F411 Generalized anxiety disorder: Secondary | ICD-10-CM | POA: Diagnosis not present

## 2016-07-09 DIAGNOSIS — Z23 Encounter for immunization: Secondary | ICD-10-CM | POA: Diagnosis not present

## 2016-07-09 DIAGNOSIS — E782 Mixed hyperlipidemia: Secondary | ICD-10-CM | POA: Diagnosis not present

## 2016-07-09 DIAGNOSIS — F331 Major depressive disorder, recurrent, moderate: Secondary | ICD-10-CM | POA: Diagnosis not present

## 2016-07-09 DIAGNOSIS — F17210 Nicotine dependence, cigarettes, uncomplicated: Secondary | ICD-10-CM | POA: Diagnosis not present

## 2016-07-09 DIAGNOSIS — M461 Sacroiliitis, not elsewhere classified: Secondary | ICD-10-CM | POA: Diagnosis not present

## 2016-07-09 DIAGNOSIS — G894 Chronic pain syndrome: Secondary | ICD-10-CM | POA: Diagnosis not present

## 2016-07-09 DIAGNOSIS — M961 Postlaminectomy syndrome, not elsewhere classified: Secondary | ICD-10-CM | POA: Diagnosis not present

## 2016-07-16 DIAGNOSIS — M961 Postlaminectomy syndrome, not elsewhere classified: Secondary | ICD-10-CM | POA: Diagnosis not present

## 2016-07-16 DIAGNOSIS — F411 Generalized anxiety disorder: Secondary | ICD-10-CM | POA: Diagnosis not present

## 2016-07-16 DIAGNOSIS — M5136 Other intervertebral disc degeneration, lumbar region: Secondary | ICD-10-CM | POA: Diagnosis not present

## 2016-07-16 DIAGNOSIS — F4542 Pain disorder with related psychological factors: Secondary | ICD-10-CM | POA: Diagnosis not present

## 2016-08-06 DIAGNOSIS — M961 Postlaminectomy syndrome, not elsewhere classified: Secondary | ICD-10-CM | POA: Diagnosis not present

## 2016-08-06 DIAGNOSIS — Z79891 Long term (current) use of opiate analgesic: Secondary | ICD-10-CM | POA: Diagnosis not present

## 2016-08-06 DIAGNOSIS — G894 Chronic pain syndrome: Secondary | ICD-10-CM | POA: Diagnosis not present

## 2016-08-06 DIAGNOSIS — M461 Sacroiliitis, not elsewhere classified: Secondary | ICD-10-CM | POA: Diagnosis not present

## 2016-09-01 DIAGNOSIS — L0390 Cellulitis, unspecified: Secondary | ICD-10-CM | POA: Diagnosis not present

## 2016-09-03 DIAGNOSIS — M961 Postlaminectomy syndrome, not elsewhere classified: Secondary | ICD-10-CM | POA: Diagnosis not present

## 2016-09-03 DIAGNOSIS — Z79891 Long term (current) use of opiate analgesic: Secondary | ICD-10-CM | POA: Diagnosis not present

## 2016-09-03 DIAGNOSIS — M461 Sacroiliitis, not elsewhere classified: Secondary | ICD-10-CM | POA: Diagnosis not present

## 2016-09-03 DIAGNOSIS — G894 Chronic pain syndrome: Secondary | ICD-10-CM | POA: Diagnosis not present

## 2016-10-01 DIAGNOSIS — M961 Postlaminectomy syndrome, not elsewhere classified: Secondary | ICD-10-CM | POA: Diagnosis not present

## 2016-10-01 DIAGNOSIS — Z79891 Long term (current) use of opiate analgesic: Secondary | ICD-10-CM | POA: Diagnosis not present

## 2016-10-01 DIAGNOSIS — M461 Sacroiliitis, not elsewhere classified: Secondary | ICD-10-CM | POA: Diagnosis not present

## 2016-10-01 DIAGNOSIS — G894 Chronic pain syndrome: Secondary | ICD-10-CM | POA: Diagnosis not present

## 2016-10-02 DIAGNOSIS — G894 Chronic pain syndrome: Secondary | ICD-10-CM | POA: Diagnosis not present

## 2016-10-02 DIAGNOSIS — M47817 Spondylosis without myelopathy or radiculopathy, lumbosacral region: Secondary | ICD-10-CM | POA: Diagnosis not present

## 2016-10-17 DIAGNOSIS — M545 Low back pain: Secondary | ICD-10-CM | POA: Diagnosis not present

## 2016-10-17 DIAGNOSIS — M47816 Spondylosis without myelopathy or radiculopathy, lumbar region: Secondary | ICD-10-CM | POA: Diagnosis not present

## 2016-10-17 DIAGNOSIS — M546 Pain in thoracic spine: Secondary | ICD-10-CM | POA: Diagnosis not present

## 2016-10-17 DIAGNOSIS — M4696 Unspecified inflammatory spondylopathy, lumbar region: Secondary | ICD-10-CM | POA: Diagnosis not present

## 2016-10-17 DIAGNOSIS — M419 Scoliosis, unspecified: Secondary | ICD-10-CM | POA: Diagnosis not present

## 2016-10-17 DIAGNOSIS — M48061 Spinal stenosis, lumbar region without neurogenic claudication: Secondary | ICD-10-CM | POA: Diagnosis not present

## 2016-10-17 DIAGNOSIS — Z9889 Other specified postprocedural states: Secondary | ICD-10-CM | POA: Diagnosis not present

## 2016-11-02 DIAGNOSIS — M47816 Spondylosis without myelopathy or radiculopathy, lumbar region: Secondary | ICD-10-CM | POA: Diagnosis not present

## 2016-11-06 DIAGNOSIS — M47816 Spondylosis without myelopathy or radiculopathy, lumbar region: Secondary | ICD-10-CM | POA: Diagnosis not present

## 2016-11-08 DIAGNOSIS — M47816 Spondylosis without myelopathy or radiculopathy, lumbar region: Secondary | ICD-10-CM | POA: Diagnosis not present

## 2016-11-20 DIAGNOSIS — M47816 Spondylosis without myelopathy or radiculopathy, lumbar region: Secondary | ICD-10-CM | POA: Diagnosis not present

## 2016-11-22 DIAGNOSIS — M47816 Spondylosis without myelopathy or radiculopathy, lumbar region: Secondary | ICD-10-CM | POA: Diagnosis not present

## 2016-11-23 DIAGNOSIS — E782 Mixed hyperlipidemia: Secondary | ICD-10-CM | POA: Diagnosis not present

## 2016-11-23 DIAGNOSIS — F17210 Nicotine dependence, cigarettes, uncomplicated: Secondary | ICD-10-CM | POA: Diagnosis not present

## 2016-11-23 DIAGNOSIS — F411 Generalized anxiety disorder: Secondary | ICD-10-CM | POA: Diagnosis not present

## 2016-11-23 DIAGNOSIS — E0965 Drug or chemical induced diabetes mellitus with hyperglycemia: Secondary | ICD-10-CM | POA: Diagnosis not present

## 2016-11-23 DIAGNOSIS — F331 Major depressive disorder, recurrent, moderate: Secondary | ICD-10-CM | POA: Diagnosis not present

## 2016-11-27 DIAGNOSIS — M47817 Spondylosis without myelopathy or radiculopathy, lumbosacral region: Secondary | ICD-10-CM | POA: Diagnosis not present

## 2016-11-27 DIAGNOSIS — R609 Edema, unspecified: Secondary | ICD-10-CM | POA: Diagnosis not present

## 2016-11-27 DIAGNOSIS — M961 Postlaminectomy syndrome, not elsewhere classified: Secondary | ICD-10-CM | POA: Diagnosis not present

## 2016-11-27 DIAGNOSIS — F329 Major depressive disorder, single episode, unspecified: Secondary | ICD-10-CM | POA: Diagnosis not present

## 2016-11-27 DIAGNOSIS — R0602 Shortness of breath: Secondary | ICD-10-CM | POA: Diagnosis not present

## 2016-11-27 DIAGNOSIS — G894 Chronic pain syndrome: Secondary | ICD-10-CM | POA: Diagnosis not present

## 2016-11-27 DIAGNOSIS — M79604 Pain in right leg: Secondary | ICD-10-CM | POA: Diagnosis not present

## 2016-11-27 DIAGNOSIS — Z79891 Long term (current) use of opiate analgesic: Secondary | ICD-10-CM | POA: Diagnosis not present

## 2016-11-30 DIAGNOSIS — M47816 Spondylosis without myelopathy or radiculopathy, lumbar region: Secondary | ICD-10-CM | POA: Diagnosis not present

## 2016-12-04 DIAGNOSIS — M4696 Unspecified inflammatory spondylopathy, lumbar region: Secondary | ICD-10-CM | POA: Diagnosis not present

## 2016-12-04 DIAGNOSIS — M47816 Spondylosis without myelopathy or radiculopathy, lumbar region: Secondary | ICD-10-CM | POA: Diagnosis not present

## 2016-12-11 DIAGNOSIS — M47816 Spondylosis without myelopathy or radiculopathy, lumbar region: Secondary | ICD-10-CM | POA: Diagnosis not present

## 2016-12-13 DIAGNOSIS — M47816 Spondylosis without myelopathy or radiculopathy, lumbar region: Secondary | ICD-10-CM | POA: Diagnosis not present

## 2016-12-17 DIAGNOSIS — M47816 Spondylosis without myelopathy or radiculopathy, lumbar region: Secondary | ICD-10-CM | POA: Diagnosis not present

## 2016-12-20 DIAGNOSIS — M47816 Spondylosis without myelopathy or radiculopathy, lumbar region: Secondary | ICD-10-CM | POA: Diagnosis not present

## 2016-12-26 DIAGNOSIS — G8929 Other chronic pain: Secondary | ICD-10-CM | POA: Diagnosis not present

## 2016-12-26 DIAGNOSIS — M47817 Spondylosis without myelopathy or radiculopathy, lumbosacral region: Secondary | ICD-10-CM | POA: Diagnosis not present

## 2016-12-26 DIAGNOSIS — G894 Chronic pain syndrome: Secondary | ICD-10-CM | POA: Diagnosis not present

## 2016-12-26 DIAGNOSIS — M961 Postlaminectomy syndrome, not elsewhere classified: Secondary | ICD-10-CM | POA: Diagnosis not present

## 2017-01-08 DIAGNOSIS — M47816 Spondylosis without myelopathy or radiculopathy, lumbar region: Secondary | ICD-10-CM | POA: Diagnosis not present

## 2017-01-18 DIAGNOSIS — M48061 Spinal stenosis, lumbar region without neurogenic claudication: Secondary | ICD-10-CM | POA: Diagnosis not present

## 2017-01-18 DIAGNOSIS — M4696 Unspecified inflammatory spondylopathy, lumbar region: Secondary | ICD-10-CM | POA: Diagnosis not present

## 2017-01-18 DIAGNOSIS — M47816 Spondylosis without myelopathy or radiculopathy, lumbar region: Secondary | ICD-10-CM | POA: Diagnosis not present

## 2017-02-22 DIAGNOSIS — E782 Mixed hyperlipidemia: Secondary | ICD-10-CM | POA: Diagnosis not present

## 2017-02-22 DIAGNOSIS — F331 Major depressive disorder, recurrent, moderate: Secondary | ICD-10-CM | POA: Diagnosis not present

## 2017-02-22 DIAGNOSIS — F17210 Nicotine dependence, cigarettes, uncomplicated: Secondary | ICD-10-CM | POA: Diagnosis not present

## 2017-02-22 DIAGNOSIS — M545 Low back pain: Secondary | ICD-10-CM | POA: Diagnosis not present

## 2017-02-22 DIAGNOSIS — F411 Generalized anxiety disorder: Secondary | ICD-10-CM | POA: Diagnosis not present

## 2017-03-01 DIAGNOSIS — M4696 Unspecified inflammatory spondylopathy, lumbar region: Secondary | ICD-10-CM | POA: Diagnosis not present

## 2017-03-01 DIAGNOSIS — M542 Cervicalgia: Secondary | ICD-10-CM | POA: Diagnosis not present

## 2017-03-01 DIAGNOSIS — M48061 Spinal stenosis, lumbar region without neurogenic claudication: Secondary | ICD-10-CM | POA: Diagnosis not present

## 2017-03-01 DIAGNOSIS — M47816 Spondylosis without myelopathy or radiculopathy, lumbar region: Secondary | ICD-10-CM | POA: Diagnosis not present

## 2017-05-10 DIAGNOSIS — E782 Mixed hyperlipidemia: Secondary | ICD-10-CM | POA: Diagnosis not present

## 2017-05-10 DIAGNOSIS — M545 Low back pain: Secondary | ICD-10-CM | POA: Diagnosis not present

## 2017-05-10 DIAGNOSIS — F17210 Nicotine dependence, cigarettes, uncomplicated: Secondary | ICD-10-CM | POA: Diagnosis not present

## 2017-05-10 DIAGNOSIS — F411 Generalized anxiety disorder: Secondary | ICD-10-CM | POA: Diagnosis not present

## 2017-05-10 DIAGNOSIS — Z1389 Encounter for screening for other disorder: Secondary | ICD-10-CM | POA: Diagnosis not present

## 2017-05-10 DIAGNOSIS — F331 Major depressive disorder, recurrent, moderate: Secondary | ICD-10-CM | POA: Diagnosis not present

## 2017-05-27 IMAGING — CR DG LUMBAR SPINE 2-3V
3 series · 3 of 3 positions shown · non-contrast
Comparison: Lumbar CT myelogram 04/04/2015

CLINICAL DATA: Lumbar laminectomy.

EXAM:
LUMBAR SPINE - 2-3 VIEW

[lat (1 of 3)]
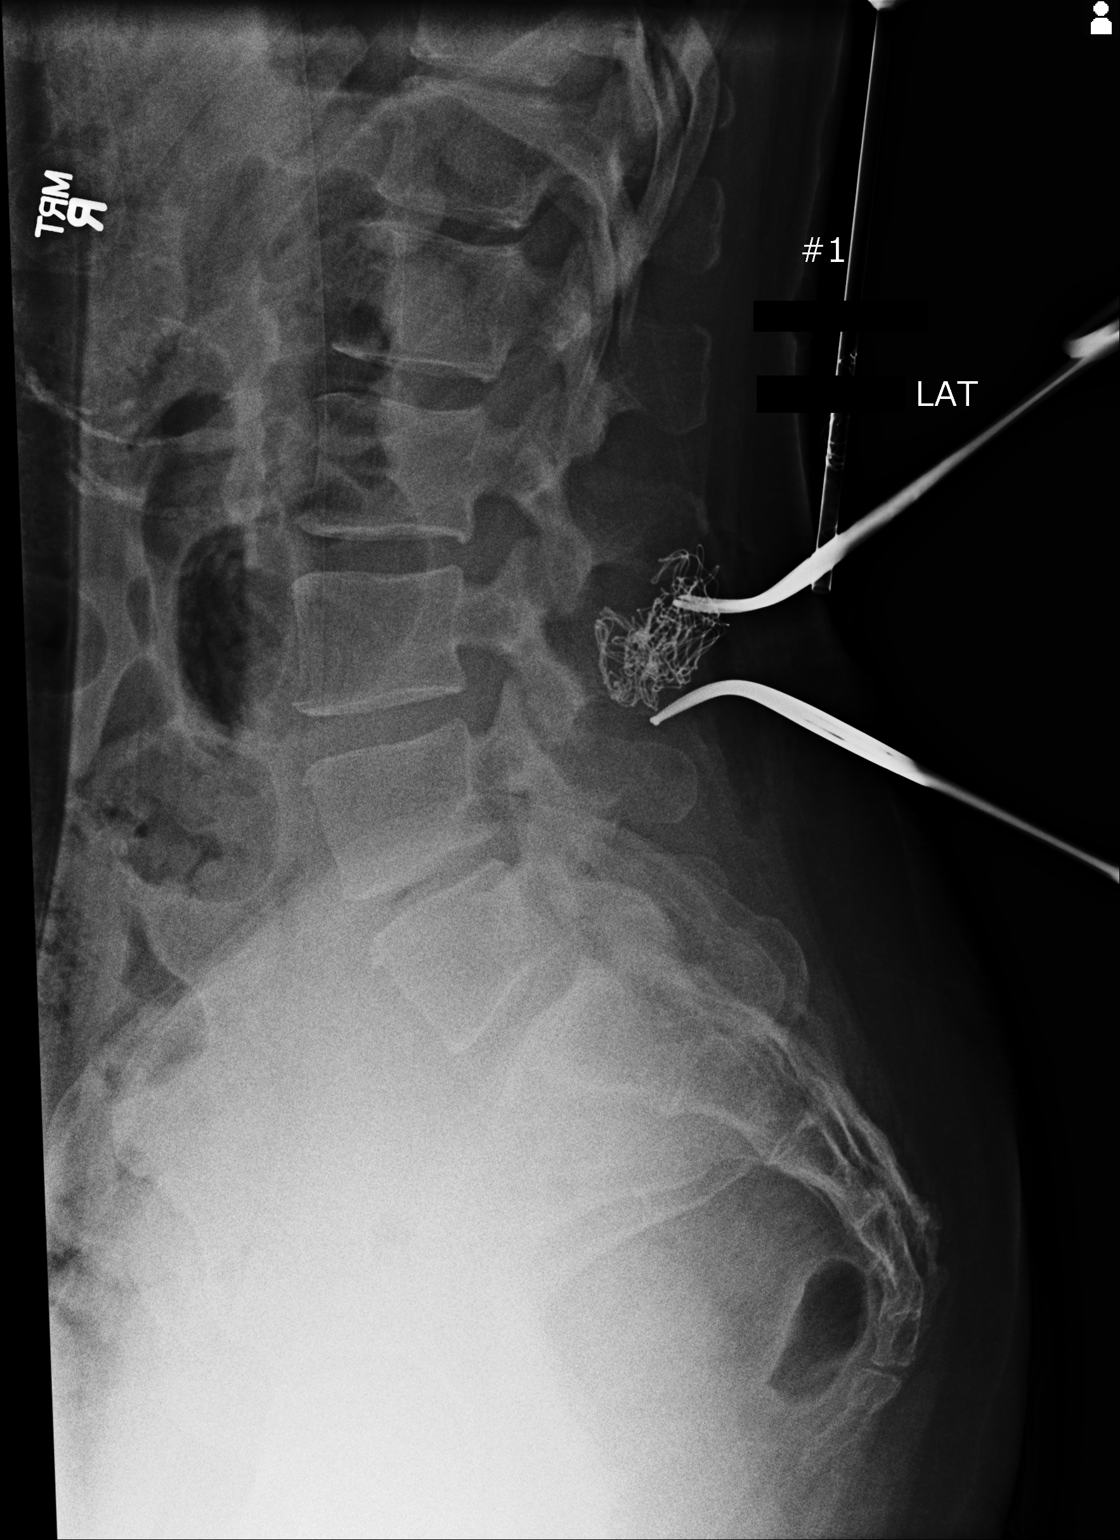

[lat (2 of 3)]
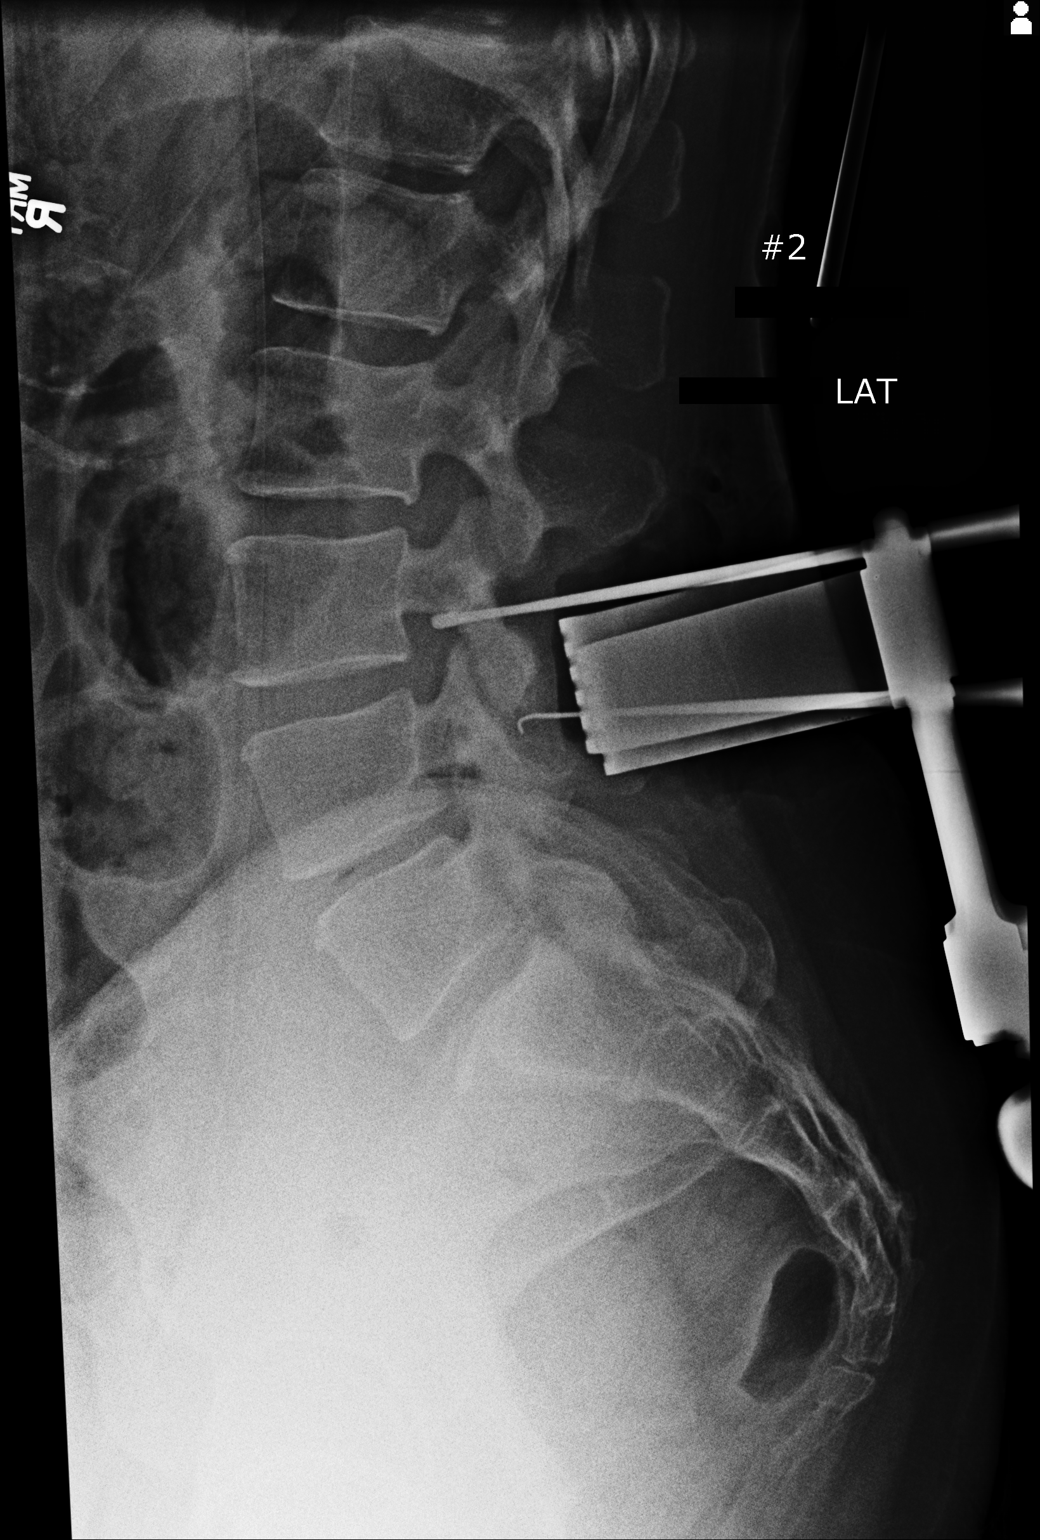

[lat (3 of 3)]
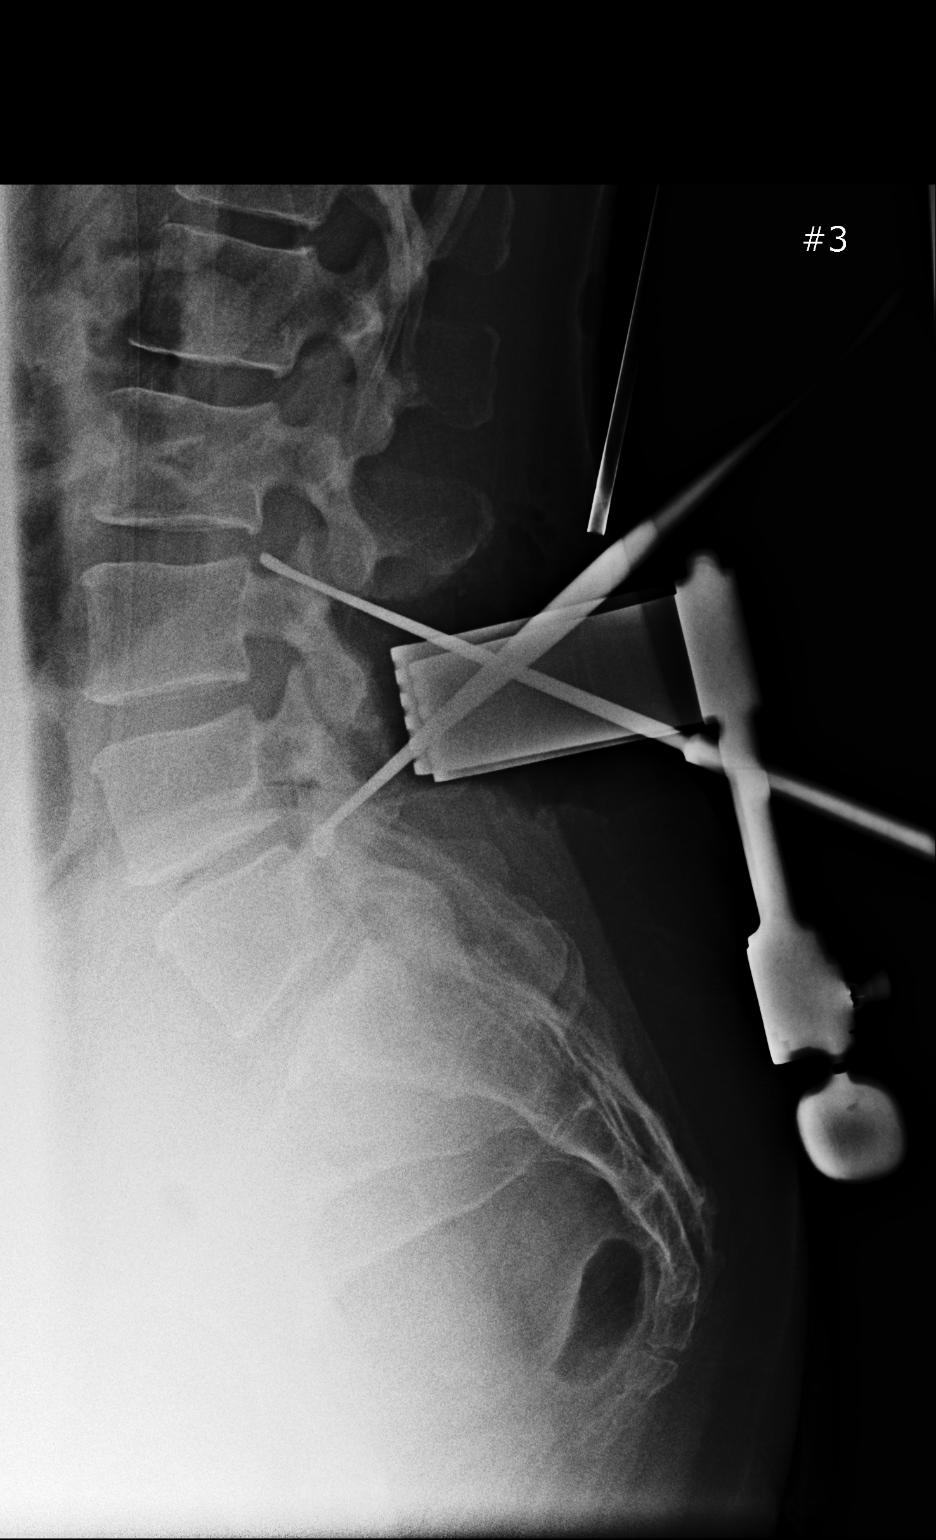

[3 of 3 positions shown; findings below may reference images not displayed]

FINDINGS: Three portable intraoperative cross-table lateral radiographs of the
lumbar spine are provided. The first demonstrates interval resection
of the L3 spinous process with a sponge and 2 blunt-tipped surgical
instruments projecting between the L2 and L4 spinous processes. The
second demonstrates tissue retractors at this location and 2
surgical instruments, 1 projecting along the inferior margin of the
L3 pedicle and 1 posterior to the inferior aspect of the L3-4 facet
joints. The third image demonstrates crossing surgical instruments,
with the tip of 1 projecting at the posterior margin of the L2-3
disc space and the other just posterior to the L4-5 disc space.
IMPRESSION: Intraoperative images during lumbar spine surgery as above.

## 2017-08-02 DIAGNOSIS — M4316 Spondylolisthesis, lumbar region: Secondary | ICD-10-CM | POA: Diagnosis not present

## 2017-08-02 DIAGNOSIS — M4716 Other spondylosis with myelopathy, lumbar region: Secondary | ICD-10-CM | POA: Diagnosis not present

## 2017-08-02 DIAGNOSIS — M48062 Spinal stenosis, lumbar region with neurogenic claudication: Secondary | ICD-10-CM | POA: Diagnosis not present

## 2017-08-08 DIAGNOSIS — M48062 Spinal stenosis, lumbar region with neurogenic claudication: Secondary | ICD-10-CM | POA: Diagnosis not present

## 2017-08-08 DIAGNOSIS — M545 Low back pain: Secondary | ICD-10-CM | POA: Diagnosis not present

## 2017-08-08 DIAGNOSIS — Z9889 Other specified postprocedural states: Secondary | ICD-10-CM | POA: Diagnosis not present

## 2017-08-13 DIAGNOSIS — Z6827 Body mass index (BMI) 27.0-27.9, adult: Secondary | ICD-10-CM | POA: Diagnosis not present

## 2017-08-13 DIAGNOSIS — M47816 Spondylosis without myelopathy or radiculopathy, lumbar region: Secondary | ICD-10-CM | POA: Diagnosis not present

## 2017-09-10 DIAGNOSIS — F331 Major depressive disorder, recurrent, moderate: Secondary | ICD-10-CM | POA: Diagnosis not present

## 2017-09-10 DIAGNOSIS — F17210 Nicotine dependence, cigarettes, uncomplicated: Secondary | ICD-10-CM | POA: Diagnosis not present

## 2017-09-10 DIAGNOSIS — F411 Generalized anxiety disorder: Secondary | ICD-10-CM | POA: Diagnosis not present

## 2017-09-10 DIAGNOSIS — E782 Mixed hyperlipidemia: Secondary | ICD-10-CM | POA: Diagnosis not present

## 2017-10-15 DIAGNOSIS — Z6828 Body mass index (BMI) 28.0-28.9, adult: Secondary | ICD-10-CM | POA: Diagnosis not present

## 2017-10-15 DIAGNOSIS — M47816 Spondylosis without myelopathy or radiculopathy, lumbar region: Secondary | ICD-10-CM | POA: Diagnosis not present

## 2017-10-15 DIAGNOSIS — M461 Sacroiliitis, not elsewhere classified: Secondary | ICD-10-CM | POA: Diagnosis not present

## 2017-10-17 ENCOUNTER — Other Ambulatory Visit: Payer: Self-pay | Admitting: Neurosurgery

## 2017-10-17 DIAGNOSIS — M461 Sacroiliitis, not elsewhere classified: Secondary | ICD-10-CM

## 2017-11-06 ENCOUNTER — Encounter: Payer: Self-pay | Admitting: Radiology

## 2017-11-06 ENCOUNTER — Ambulatory Visit
Admission: RE | Admit: 2017-11-06 | Discharge: 2017-11-06 | Disposition: A | Discharge status: Home / Self Care | Payer: Federal, State, Local not specified - PPO | Source: Ambulatory Visit | Attending: Neurosurgery | Admitting: Neurosurgery

## 2017-11-06 DIAGNOSIS — M461 Sacroiliitis, not elsewhere classified: Secondary | ICD-10-CM

## 2017-11-06 MED ORDER — IOPAMIDOL (ISOVUE-M 200) INJECTION 41%
1.0000 mL | Freq: Once | INTRAMUSCULAR | Status: AC
  Administered 2017-11-06: 1 mL via INTRA_ARTICULAR

## 2017-11-06 MED ORDER — METHYLPREDNISOLONE ACETATE 40 MG/ML INJ SUSP (RADIOLOG
120.0000 mg | Freq: Once | INTRAMUSCULAR | Status: AC
  Administered 2017-11-06: 120 mg via INTRA_ARTICULAR

## 2017-11-06 NOTE — Discharge Instructions (Signed)

## 2017-11-19 DIAGNOSIS — M4316 Spondylolisthesis, lumbar region: Secondary | ICD-10-CM | POA: Diagnosis not present

## 2017-11-19 DIAGNOSIS — M461 Sacroiliitis, not elsewhere classified: Secondary | ICD-10-CM | POA: Diagnosis not present

## 2017-11-19 DIAGNOSIS — M47816 Spondylosis without myelopathy or radiculopathy, lumbar region: Secondary | ICD-10-CM | POA: Diagnosis not present

## 2017-11-20 ENCOUNTER — Ambulatory Visit
Admission: RE | Admit: 2017-11-20 | Discharge: 2017-11-20 | Disposition: A | Discharge status: Home / Self Care | Payer: Federal, State, Local not specified - PPO | Source: Ambulatory Visit | Attending: Neurosurgery | Admitting: Neurosurgery

## 2017-11-20 DIAGNOSIS — M533 Sacrococcygeal disorders, not elsewhere classified: Secondary | ICD-10-CM | POA: Diagnosis not present

## 2017-11-20 DIAGNOSIS — M461 Sacroiliitis, not elsewhere classified: Secondary | ICD-10-CM

## 2017-11-20 MED ORDER — METHYLPREDNISOLONE ACETATE 40 MG/ML INJ SUSP (RADIOLOG
120.0000 mg | Freq: Once | INTRAMUSCULAR | Status: AC
  Administered 2017-11-20: 120 mg via EPIDURAL

## 2017-11-20 MED ORDER — IOPAMIDOL (ISOVUE-M 200) INJECTION 41%
1.0000 mL | Freq: Once | INTRAMUSCULAR | Status: AC
  Administered 2017-11-20: 1 mL via INTRA_ARTICULAR

## 2017-11-20 NOTE — Discharge Instructions (Signed)

## 2017-12-11 DIAGNOSIS — E782 Mixed hyperlipidemia: Secondary | ICD-10-CM | POA: Diagnosis not present

## 2017-12-11 DIAGNOSIS — F17210 Nicotine dependence, cigarettes, uncomplicated: Secondary | ICD-10-CM | POA: Diagnosis not present

## 2017-12-11 DIAGNOSIS — F331 Major depressive disorder, recurrent, moderate: Secondary | ICD-10-CM | POA: Diagnosis not present

## 2017-12-11 DIAGNOSIS — M545 Low back pain: Secondary | ICD-10-CM | POA: Diagnosis not present

## 2017-12-17 DIAGNOSIS — M545 Low back pain: Secondary | ICD-10-CM | POA: Diagnosis not present

## 2017-12-17 DIAGNOSIS — F17210 Nicotine dependence, cigarettes, uncomplicated: Secondary | ICD-10-CM | POA: Diagnosis not present

## 2017-12-17 DIAGNOSIS — E782 Mixed hyperlipidemia: Secondary | ICD-10-CM | POA: Diagnosis not present

## 2017-12-17 DIAGNOSIS — F411 Generalized anxiety disorder: Secondary | ICD-10-CM | POA: Diagnosis not present

## 2017-12-17 DIAGNOSIS — F331 Major depressive disorder, recurrent, moderate: Secondary | ICD-10-CM | POA: Diagnosis not present

## 2018-01-20 DIAGNOSIS — M47816 Spondylosis without myelopathy or radiculopathy, lumbar region: Secondary | ICD-10-CM | POA: Diagnosis not present

## 2018-01-20 DIAGNOSIS — Z6828 Body mass index (BMI) 28.0-28.9, adult: Secondary | ICD-10-CM | POA: Diagnosis not present

## 2018-04-15 DIAGNOSIS — F331 Major depressive disorder, recurrent, moderate: Secondary | ICD-10-CM | POA: Diagnosis not present

## 2018-04-15 DIAGNOSIS — F17210 Nicotine dependence, cigarettes, uncomplicated: Secondary | ICD-10-CM | POA: Diagnosis not present

## 2018-04-15 DIAGNOSIS — E782 Mixed hyperlipidemia: Secondary | ICD-10-CM | POA: Diagnosis not present

## 2018-04-15 DIAGNOSIS — F411 Generalized anxiety disorder: Secondary | ICD-10-CM | POA: Diagnosis not present

## 2018-04-15 DIAGNOSIS — M48062 Spinal stenosis, lumbar region with neurogenic claudication: Secondary | ICD-10-CM | POA: Diagnosis not present

## 2018-05-05 DIAGNOSIS — F411 Generalized anxiety disorder: Secondary | ICD-10-CM | POA: Diagnosis not present

## 2018-05-05 DIAGNOSIS — F17210 Nicotine dependence, cigarettes, uncomplicated: Secondary | ICD-10-CM | POA: Diagnosis not present

## 2018-05-05 DIAGNOSIS — E782 Mixed hyperlipidemia: Secondary | ICD-10-CM | POA: Diagnosis not present

## 2018-05-05 DIAGNOSIS — F331 Major depressive disorder, recurrent, moderate: Secondary | ICD-10-CM | POA: Diagnosis not present

## 2018-06-09 DIAGNOSIS — M47816 Spondylosis without myelopathy or radiculopathy, lumbar region: Secondary | ICD-10-CM | POA: Diagnosis not present

## 2018-06-09 DIAGNOSIS — M48062 Spinal stenosis, lumbar region with neurogenic claudication: Secondary | ICD-10-CM | POA: Diagnosis not present

## 2018-06-09 DIAGNOSIS — Z6829 Body mass index (BMI) 29.0-29.9, adult: Secondary | ICD-10-CM | POA: Diagnosis not present

## 2018-06-23 DIAGNOSIS — F17210 Nicotine dependence, cigarettes, uncomplicated: Secondary | ICD-10-CM | POA: Diagnosis not present

## 2018-06-23 DIAGNOSIS — E782 Mixed hyperlipidemia: Secondary | ICD-10-CM | POA: Diagnosis not present

## 2018-06-23 DIAGNOSIS — Z1331 Encounter for screening for depression: Secondary | ICD-10-CM | POA: Diagnosis not present

## 2018-06-23 DIAGNOSIS — Z1389 Encounter for screening for other disorder: Secondary | ICD-10-CM | POA: Diagnosis not present

## 2018-06-23 DIAGNOSIS — J301 Allergic rhinitis due to pollen: Secondary | ICD-10-CM | POA: Diagnosis not present

## 2018-06-23 DIAGNOSIS — M545 Low back pain: Secondary | ICD-10-CM | POA: Diagnosis not present

## 2018-08-11 DIAGNOSIS — E782 Mixed hyperlipidemia: Secondary | ICD-10-CM | POA: Diagnosis not present

## 2018-08-11 DIAGNOSIS — F17210 Nicotine dependence, cigarettes, uncomplicated: Secondary | ICD-10-CM | POA: Diagnosis not present

## 2018-08-11 DIAGNOSIS — Z9189 Other specified personal risk factors, not elsewhere classified: Secondary | ICD-10-CM | POA: Diagnosis not present

## 2018-08-18 DIAGNOSIS — F411 Generalized anxiety disorder: Secondary | ICD-10-CM | POA: Diagnosis not present

## 2018-08-18 DIAGNOSIS — E782 Mixed hyperlipidemia: Secondary | ICD-10-CM | POA: Diagnosis not present

## 2018-08-18 DIAGNOSIS — G252 Other specified forms of tremor: Secondary | ICD-10-CM | POA: Diagnosis not present

## 2018-08-18 DIAGNOSIS — F331 Major depressive disorder, recurrent, moderate: Secondary | ICD-10-CM | POA: Diagnosis not present

## 2018-08-18 DIAGNOSIS — M545 Low back pain: Secondary | ICD-10-CM | POA: Diagnosis not present

## 2018-08-18 DIAGNOSIS — Z23 Encounter for immunization: Secondary | ICD-10-CM | POA: Diagnosis not present

## 2018-08-18 DIAGNOSIS — F17210 Nicotine dependence, cigarettes, uncomplicated: Secondary | ICD-10-CM | POA: Diagnosis not present

## 2018-10-27 DIAGNOSIS — M47816 Spondylosis without myelopathy or radiculopathy, lumbar region: Secondary | ICD-10-CM | POA: Diagnosis not present

## 2018-10-27 DIAGNOSIS — M48062 Spinal stenosis, lumbar region with neurogenic claudication: Secondary | ICD-10-CM | POA: Diagnosis not present

## 2018-10-27 DIAGNOSIS — Z6830 Body mass index (BMI) 30.0-30.9, adult: Secondary | ICD-10-CM | POA: Diagnosis not present

## 2018-12-08 DIAGNOSIS — M545 Low back pain: Secondary | ICD-10-CM | POA: Diagnosis not present

## 2018-12-08 DIAGNOSIS — F331 Major depressive disorder, recurrent, moderate: Secondary | ICD-10-CM | POA: Diagnosis not present

## 2018-12-08 DIAGNOSIS — E6609 Other obesity due to excess calories: Secondary | ICD-10-CM | POA: Diagnosis not present

## 2018-12-08 DIAGNOSIS — E782 Mixed hyperlipidemia: Secondary | ICD-10-CM | POA: Diagnosis not present

## 2018-12-08 DIAGNOSIS — F411 Generalized anxiety disorder: Secondary | ICD-10-CM | POA: Diagnosis not present

## 2018-12-08 DIAGNOSIS — G252 Other specified forms of tremor: Secondary | ICD-10-CM | POA: Diagnosis not present

## 2018-12-08 DIAGNOSIS — F17210 Nicotine dependence, cigarettes, uncomplicated: Secondary | ICD-10-CM | POA: Diagnosis not present

## 2019-01-19 DIAGNOSIS — Z6830 Body mass index (BMI) 30.0-30.9, adult: Secondary | ICD-10-CM | POA: Diagnosis not present

## 2019-01-19 DIAGNOSIS — M47816 Spondylosis without myelopathy or radiculopathy, lumbar region: Secondary | ICD-10-CM | POA: Diagnosis not present

## 2019-01-19 DIAGNOSIS — M48062 Spinal stenosis, lumbar region with neurogenic claudication: Secondary | ICD-10-CM | POA: Diagnosis not present

## 2019-05-13 DIAGNOSIS — M545 Low back pain: Secondary | ICD-10-CM | POA: Diagnosis not present

## 2019-05-13 DIAGNOSIS — Z6832 Body mass index (BMI) 32.0-32.9, adult: Secondary | ICD-10-CM | POA: Diagnosis not present

## 2019-05-13 DIAGNOSIS — R030 Elevated blood-pressure reading, without diagnosis of hypertension: Secondary | ICD-10-CM | POA: Diagnosis not present

## 2019-05-13 DIAGNOSIS — M5416 Radiculopathy, lumbar region: Secondary | ICD-10-CM | POA: Diagnosis not present

## 2019-05-27 DIAGNOSIS — M5126 Other intervertebral disc displacement, lumbar region: Secondary | ICD-10-CM | POA: Diagnosis not present

## 2019-05-27 DIAGNOSIS — M47816 Spondylosis without myelopathy or radiculopathy, lumbar region: Secondary | ICD-10-CM | POA: Diagnosis not present

## 2019-05-27 DIAGNOSIS — M5416 Radiculopathy, lumbar region: Secondary | ICD-10-CM | POA: Diagnosis not present

## 2019-07-02 DIAGNOSIS — M5416 Radiculopathy, lumbar region: Secondary | ICD-10-CM | POA: Diagnosis not present

## 2019-08-06 DIAGNOSIS — M545 Low back pain: Secondary | ICD-10-CM | POA: Diagnosis not present

## 2019-08-06 DIAGNOSIS — M5416 Radiculopathy, lumbar region: Secondary | ICD-10-CM | POA: Diagnosis not present

## 2019-08-20 DIAGNOSIS — M5416 Radiculopathy, lumbar region: Secondary | ICD-10-CM | POA: Diagnosis not present

## 2019-08-24 DIAGNOSIS — F331 Major depressive disorder, recurrent, moderate: Secondary | ICD-10-CM | POA: Diagnosis not present

## 2019-08-24 DIAGNOSIS — R4582 Worries: Secondary | ICD-10-CM | POA: Diagnosis not present

## 2019-08-24 DIAGNOSIS — F411 Generalized anxiety disorder: Secondary | ICD-10-CM | POA: Diagnosis not present

## 2019-08-24 DIAGNOSIS — E782 Mixed hyperlipidemia: Secondary | ICD-10-CM | POA: Diagnosis not present

## 2019-08-24 DIAGNOSIS — F17210 Nicotine dependence, cigarettes, uncomplicated: Secondary | ICD-10-CM | POA: Diagnosis not present

## 2019-09-14 DIAGNOSIS — M5416 Radiculopathy, lumbar region: Secondary | ICD-10-CM | POA: Diagnosis not present

## 2019-09-14 DIAGNOSIS — M545 Low back pain: Secondary | ICD-10-CM | POA: Diagnosis not present

## 2019-09-14 DIAGNOSIS — R030 Elevated blood-pressure reading, without diagnosis of hypertension: Secondary | ICD-10-CM | POA: Diagnosis not present

## 2019-09-14 DIAGNOSIS — Z6833 Body mass index (BMI) 33.0-33.9, adult: Secondary | ICD-10-CM | POA: Diagnosis not present

## 2019-12-26 IMAGING — XA DG FLUORO GUIDE SPINAL/SI JT INJ*R*
1 series · 1 of 1 positions shown · non-contrast
Comparison: none

CLINICAL DATA: Bilateral posterior pelvic pain.  Sacroiliitis.

[Series 1: ortho standard · 1 of 1 slices shown]
[im 1/1]
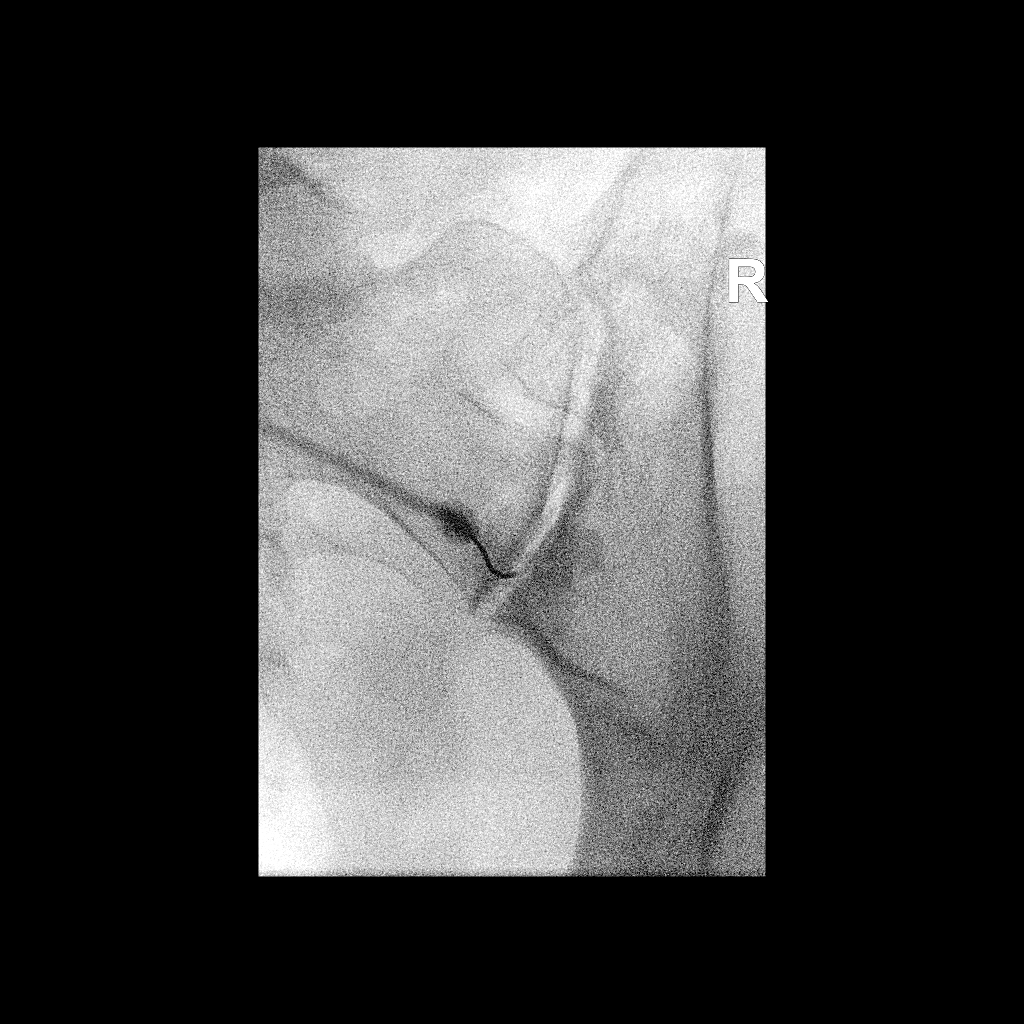

[1 of 1 positions shown; findings below may reference images not displayed]

FLUOROSCOPY TIME:  Radiation Exposure Index (as provided by the
fluoroscopic device): 12.02 uGy*m2

Fluoroscopy Time:  14 seconds

Number of Acquired Images:  0

PROCEDURE:
Right SI JOINT INJECTION.

After a thorough discussion of risks and benefits of the procedure,
including bleeding, infection, injury to nerves, blood vessels, and
adjacent structures, verbal and written consent was obtained.
Specific risks of the procedure included
nondiagnostic/nontherapeutic injection and non target injection. The
patient was placed prone on the fluoroscopy table and localization
was performed over the sacrum. Target site marked using fluoroscopic
guidance. The skin was prepped and draped in the usual sterile
fashion using Betadine soap.

After local anesthesia with 1% lidocaine without epinephrine and
subsequent deep anesthesia, a 22g spinal needle was advanced into
the right SI joint. Injection of 0.5 ml Isovue-M 200 confirmed
intra-articular placement. No vascular uptake present. Subsequently,
120 of Depo-Medrol and 1.5 mL 1% lidocaine was injected into SI
joint. Needles removed and a sterile dressing applied.

No complications were observed. The patient was observed and
released under the care of a driver after 30 minutes.
IMPRESSION: Successful fluoroscopically guided  SI joint injection.

## 2020-01-19 DIAGNOSIS — R600 Localized edema: Secondary | ICD-10-CM | POA: Diagnosis not present

## 2020-01-19 DIAGNOSIS — R0602 Shortness of breath: Secondary | ICD-10-CM | POA: Diagnosis not present

## 2021-08-14 ENCOUNTER — Other Ambulatory Visit: Payer: Self-pay | Admitting: Neurosurgery

## 2021-08-14 DIAGNOSIS — M47816 Spondylosis without myelopathy or radiculopathy, lumbar region: Secondary | ICD-10-CM

## 2022-11-13 ENCOUNTER — Encounter (HOSPITAL_BASED_OUTPATIENT_CLINIC_OR_DEPARTMENT_OTHER): Payer: Self-pay

## 2022-11-13 DIAGNOSIS — R5383 Other fatigue: Secondary | ICD-10-CM

## 2022-11-13 DIAGNOSIS — R0683 Snoring: Secondary | ICD-10-CM
